# Patient Record
Sex: Female | Born: 1995 | Race: Black or African American | Hispanic: No | Marital: Single | Smoking: Never smoker
Health system: Southern US, Community
[De-identification: ages and names within clinical notes are randomized; demographics above are authoritative.]

## PROBLEM LIST (undated history)

## (undated) ENCOUNTER — Inpatient Hospital Stay (HOSPITAL_COMMUNITY): Payer: Self-pay

## (undated) ENCOUNTER — Emergency Department (HOSPITAL_COMMUNITY): Admission: EM | Discharge status: Absent without leave | Payer: Self-pay

## (undated) DIAGNOSIS — B359 Dermatophytosis, unspecified: Secondary | ICD-10-CM

## (undated) HISTORY — DX: Dermatophytosis, unspecified: B35.9

## (undated) HISTORY — PX: WISDOM TOOTH EXTRACTION: SHX21

---
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
## 2014-01-06 ENCOUNTER — Encounter: Payer: BC Managed Care – HMO | Admitting: Obstetrics and Gynecology

## 2014-01-19 ENCOUNTER — Ambulatory Visit (INDEPENDENT_AMBULATORY_CARE_PROVIDER_SITE_OTHER): Payer: BC Managed Care – PPO | Admitting: Advanced Practice Midwife

## 2014-01-19 VITALS — BP 116/65 | HR 99 | Temp 99.0°F | Wt 175.0 lb

## 2014-01-19 DIAGNOSIS — L209 Atopic dermatitis, unspecified: Secondary | ICD-10-CM

## 2014-01-19 DIAGNOSIS — Z3480 Encounter for supervision of other normal pregnancy, unspecified trimester: Secondary | ICD-10-CM

## 2014-01-19 DIAGNOSIS — L2089 Other atopic dermatitis: Secondary | ICD-10-CM

## 2014-01-19 DIAGNOSIS — IMO0002 Reserved for concepts with insufficient information to code with codable children: Secondary | ICD-10-CM

## 2014-01-19 DIAGNOSIS — Z0489 Encounter for examination and observation for other specified reasons: Secondary | ICD-10-CM

## 2014-01-19 LAB — POCT URINALYSIS DIP (DEVICE)
Bilirubin Urine: NEGATIVE
Glucose, UA: NEGATIVE mg/dL
HGB URINE DIPSTICK: NEGATIVE
Ketones, ur: NEGATIVE mg/dL
Leukocytes, UA: NEGATIVE
Nitrite: NEGATIVE
PH: 7 (ref 5.0–8.0)
Protein, ur: NEGATIVE mg/dL
Specific Gravity, Urine: 1.015 (ref 1.005–1.030)
UROBILINOGEN UA: 0.2 mg/dL (ref 0.0–1.0)

## 2014-01-19 MED ORDER — HYDROCORTISONE 1 % EX LOTN: 1.0000 "application " | TOPICAL_LOTION | Freq: Two times a day (BID) | CUTANEOUS | Status: DC

## 2014-01-19 NOTE — Progress Notes (Signed)
Pain-back F/u US scheduled @ July 16th @ 0730 [facility] scheduled for October 9th @ 0930.  [facility] @ [phone].

## 2014-01-19 NOTE — Progress Notes (Signed)
Doing well.  Good fetal movement, denies vaginal bleeding, LOF, regular contractions. Pt reports skin rash still present, treated x3 months for ringworm with topical anti-fungal with no improvement.  Circular patches of dry skin on arms/legs with itching.  Consider eczema or contact dermatitis so hydrocortisone lotion Rx sent to pharmacy.  Dermatology referral made.

## 2014-01-21 ENCOUNTER — Ambulatory Visit (HOSPITAL_COMMUNITY)
Admission: RE | Admit: 2014-01-21 | Discharge: 2014-01-21 | Disposition: A | Discharge status: Home / Self Care | Payer: BC Managed Care – PPO | Source: Ambulatory Visit | Attending: Advanced Practice Midwife | Admitting: Advanced Practice Midwife

## 2014-01-21 DIAGNOSIS — Z0489 Encounter for examination and observation for other specified reasons: Secondary | ICD-10-CM

## 2014-01-21 DIAGNOSIS — Z363 Encounter for antenatal screening for malformations: Secondary | ICD-10-CM | POA: Insufficient documentation

## 2014-01-21 DIAGNOSIS — Z1389 Encounter for screening for other disorder: Secondary | ICD-10-CM | POA: Insufficient documentation

## 2014-01-21 DIAGNOSIS — IMO0002 Reserved for concepts with insufficient information to code with codable children: Secondary | ICD-10-CM

## 2014-02-17 ENCOUNTER — Ambulatory Visit (INDEPENDENT_AMBULATORY_CARE_PROVIDER_SITE_OTHER): Payer: BC Managed Care – PPO | Admitting: Advanced Practice Midwife

## 2014-02-17 VITALS — BP 109/60 | HR 83 | Wt 177.7 lb

## 2014-02-17 DIAGNOSIS — Z23 Encounter for immunization: Secondary | ICD-10-CM | POA: Diagnosis not present

## 2014-02-17 DIAGNOSIS — Z3480 Encounter for supervision of other normal pregnancy, unspecified trimester: Secondary | ICD-10-CM

## 2014-02-17 DIAGNOSIS — Z3492 Encounter for supervision of normal pregnancy, unspecified, second trimester: Secondary | ICD-10-CM

## 2014-02-17 LAB — CBC
HEMATOCRIT: 31 % — AB (ref 36.0–46.0)
Hemoglobin: 10 g/dL — ABNORMAL LOW (ref 12.0–15.0)
MCH: 24.5 pg — ABNORMAL LOW (ref 26.0–34.0)
MCHC: 32.3 g/dL (ref 30.0–36.0)
MCV: 76 fL — AB (ref 78.0–100.0)
Platelets: 222 10*3/uL (ref 150–400)
RBC: 4.08 MIL/uL (ref 3.87–5.11)
RDW: 16.2 % — ABNORMAL HIGH (ref 11.5–15.5)
WBC: 5.8 10*3/uL (ref 4.0–10.5)

## 2014-02-17 LAB — POCT URINALYSIS DIP (DEVICE)
BILIRUBIN URINE: NEGATIVE
Glucose, UA: NEGATIVE mg/dL
Hgb urine dipstick: NEGATIVE
KETONES UR: NEGATIVE mg/dL
Nitrite: NEGATIVE
PH: 7 (ref 5.0–8.0)
PROTEIN: NEGATIVE mg/dL
SPECIFIC GRAVITY, URINE: 1.015 (ref 1.005–1.030)
Urobilinogen, UA: 1 mg/dL (ref 0.0–1.0)

## 2014-02-17 MED ORDER — TETANUS-DIPHTH-ACELL PERTUSSIS 5-2.5-18.5 LF-MCG/0.5 IM SUSP: 0.5000 mL | Freq: Once | INTRAMUSCULAR | Status: DC

## 2014-02-17 NOTE — Progress Notes (Signed)
No complaints. Feeling well. GTT and labs today. Desire Tdap.

## 2014-02-18 LAB — RPR

## 2014-02-18 LAB — GLUCOSE TOLERANCE, 1 HOUR (50G) W/O FASTING: GLUCOSE 1 HOUR GTT: 72 mg/dL (ref 70–140)

## 2014-02-18 LAB — HIV ANTIBODY (ROUTINE TESTING W REFLEX): HIV 1&2 Ab, 4th Generation: NONREACTIVE

## 2014-03-10 ENCOUNTER — Encounter: Payer: BC Managed Care – PPO | Admitting: Advanced Practice Midwife

## 2014-03-24 ENCOUNTER — Ambulatory Visit (INDEPENDENT_AMBULATORY_CARE_PROVIDER_SITE_OTHER): Payer: BC Managed Care – PPO | Admitting: Advanced Practice Midwife

## 2014-03-24 ENCOUNTER — Other Ambulatory Visit: Payer: Self-pay | Admitting: Advanced Practice Midwife

## 2014-03-24 VITALS — BP 104/60 | HR 75 | Temp 98.4°F | Wt 181.4 lb

## 2014-03-24 DIAGNOSIS — Z23 Encounter for immunization: Secondary | ICD-10-CM

## 2014-03-24 DIAGNOSIS — O26893 Other specified pregnancy related conditions, third trimester: Secondary | ICD-10-CM

## 2014-03-24 DIAGNOSIS — Z3480 Encounter for supervision of other normal pregnancy, unspecified trimester: Secondary | ICD-10-CM

## 2014-03-24 DIAGNOSIS — O4703 False labor before 37 completed weeks of gestation, third trimester: Secondary | ICD-10-CM

## 2014-03-24 DIAGNOSIS — N898 Other specified noninflammatory disorders of vagina: Secondary | ICD-10-CM

## 2014-03-24 DIAGNOSIS — Z3493 Encounter for supervision of normal pregnancy, unspecified, third trimester: Secondary | ICD-10-CM

## 2014-03-24 DIAGNOSIS — O9989 Other specified diseases and conditions complicating pregnancy, childbirth and the puerperium: Secondary | ICD-10-CM

## 2014-03-24 DIAGNOSIS — O4700 False labor before 37 completed weeks of gestation, unspecified trimester: Secondary | ICD-10-CM

## 2014-03-24 LAB — POCT URINALYSIS DIP (DEVICE)
BILIRUBIN URINE: NEGATIVE
Glucose, UA: NEGATIVE mg/dL
Hgb urine dipstick: NEGATIVE
Ketones, ur: NEGATIVE mg/dL
NITRITE: NEGATIVE
PH: 7 (ref 5.0–8.0)
PROTEIN: NEGATIVE mg/dL
Specific Gravity, Urine: 1.015 (ref 1.005–1.030)
Urobilinogen, UA: 0.2 mg/dL (ref 0.0–1.0)

## 2014-03-24 NOTE — Patient Instructions (Signed)

## 2014-03-24 NOTE — Progress Notes (Signed)
C/o of intermittent pelvic pressure and cramping.  C/o of thick white discharge causing vaginal itching and states "It feels swollen down there." Wet prep today.  Discussed flu vaccine-- patient desires today.

## 2014-03-24 NOTE — Progress Notes (Signed)
4 UC's/hour. Increased pelvic pressure, white discharge, ?leaking small amount of fluid, vaginal engorgement. Last IC yesterday. Neg pool. Small amount of creamy, mildly malodorous white discharge. Baby engaged in pelvis. PTL precautions. Wet Prep, GC/CT. Reviewed 28 week labs.

## 2014-03-25 LAB — WET PREP, GENITAL
CLUE CELLS WET PREP: NONE SEEN
Trich, Wet Prep: NONE SEEN
Yeast Wet Prep HPF POC: NONE SEEN

## 2014-03-25 LAB — GC/CHLAMYDIA PROBE AMP
CT Probe RNA: NEGATIVE
GC Probe RNA: NEGATIVE

## 2014-04-07 ENCOUNTER — Encounter: Payer: BC Managed Care – PPO | Admitting: Advanced Practice Midwife

## 2014-04-14 ENCOUNTER — Encounter: Payer: Self-pay | Admitting: Obstetrics and Gynecology

## 2014-04-14 ENCOUNTER — Ambulatory Visit (INDEPENDENT_AMBULATORY_CARE_PROVIDER_SITE_OTHER): Payer: BC Managed Care – PPO | Admitting: Obstetrics and Gynecology

## 2014-04-14 VITALS — BP 112/67 | HR 93 | Wt 182.1 lb

## 2014-04-14 DIAGNOSIS — O09893 Supervision of other high risk pregnancies, third trimester: Secondary | ICD-10-CM | POA: Insufficient documentation

## 2014-04-14 LAB — POCT URINALYSIS DIP (DEVICE)
BILIRUBIN URINE: NEGATIVE
GLUCOSE, UA: NEGATIVE mg/dL
HGB URINE DIPSTICK: NEGATIVE
Ketones, ur: NEGATIVE mg/dL
NITRITE: NEGATIVE
Protein, ur: NEGATIVE mg/dL
Specific Gravity, Urine: 1.02 (ref 1.005–1.030)
Urobilinogen, UA: 1 mg/dL (ref 0.0–1.0)
pH: 6 (ref 5.0–8.0)

## 2014-04-14 NOTE — Patient Instructions (Signed)

## 2014-04-14 NOTE — Progress Notes (Signed)
Pt experiencing pelvic pain and back pain.

## 2014-04-14 NOTE — Progress Notes (Signed)
Irregular UCs, pressure, groin and LBP, pelvic pain>Head low, cx posterior, very soft. Signs/sx labor and plans reviewed.  Hgb 10 and has ice PICA. Advised iron rich foods and start FeSo4 1 /d. Reviewed plans, normal GCT. Cultures next.

## 2014-04-21 ENCOUNTER — Ambulatory Visit (INDEPENDENT_AMBULATORY_CARE_PROVIDER_SITE_OTHER): Payer: BC Managed Care – PPO | Admitting: Advanced Practice Midwife

## 2014-04-21 VITALS — BP 110/63 | HR 88 | Temp 98.6°F | Wt 183.2 lb

## 2014-04-21 DIAGNOSIS — Z3493 Encounter for supervision of normal pregnancy, unspecified, third trimester: Secondary | ICD-10-CM

## 2014-04-21 DIAGNOSIS — Z3483 Encounter for supervision of other normal pregnancy, third trimester: Secondary | ICD-10-CM

## 2014-04-21 LAB — POCT URINALYSIS DIP (DEVICE)
Bilirubin Urine: NEGATIVE
GLUCOSE, UA: NEGATIVE mg/dL
Hgb urine dipstick: NEGATIVE
Ketones, ur: NEGATIVE mg/dL
Leukocytes, UA: NEGATIVE
Nitrite: NEGATIVE
Protein, ur: NEGATIVE mg/dL
Specific Gravity, Urine: 1.015 (ref 1.005–1.030)
UROBILINOGEN UA: 0.2 mg/dL (ref 0.0–1.0)
pH: 8.5 — ABNORMAL HIGH (ref 5.0–8.0)

## 2014-04-21 LAB — OB RESULTS CONSOLE GC/CHLAMYDIA
Chlamydia: NEGATIVE
GC PROBE AMP, GENITAL: NEGATIVE

## 2014-04-21 NOTE — Progress Notes (Signed)
GSB, GC/Chl today.

## 2014-04-21 NOTE — Progress Notes (Signed)
Reports intermittent pelvic pressure and irregular contractions since 7am this morning-- reports contractions are painful when they occur.  GBS and cultures today.

## 2014-04-21 NOTE — Patient Instructions (Signed)
Braxton Hicks Contractions Contractions of the uterus can occur throughout pregnancy. Contractions are not always a sign that you are in labor.  WHAT ARE BRAXTON HICKS CONTRACTIONS?  Contractions that occur before labor are called Braxton Hicks contractions, or false labor. Toward the end of pregnancy (32-34 weeks), these contractions can develop more often and may become more forceful. This is not true labor because these contractions do not result in opening (dilatation) and thinning of the cervix. They are sometimes difficult to tell apart from true labor because these contractions can be forceful and people have different pain tolerances. You should not feel embarrassed if you go to the hospital with false labor. Sometimes, the only way to tell if you are in true labor is for your health care provider to look for changes in the cervix. If there are no prenatal problems or other health problems associated with the pregnancy, it is completely safe to be sent home with false labor and await the onset of true labor. HOW CAN YOU TELL THE DIFFERENCE BETWEEN TRUE AND FALSE LABOR? False Labor  The contractions of false labor are usually shorter and not as hard as those of true labor.   The contractions are usually irregular.   The contractions are often felt in the front of the lower abdomen and in the groin.   The contractions may go away when you walk around or change positions while lying down.   The contractions get weaker and are shorter lasting as time goes on.   The contractions do not usually become progressively stronger, regular, and closer together as with true labor.  True Labor  Contractions in true labor last 30-70 seconds, become very regular, usually become more intense, and increase in frequency.   The contractions do not go away with walking.   The discomfort is usually felt in the top of the uterus and spreads to the lower abdomen and low back.   True labor can be  determined by your health care provider with an exam. This will show that the cervix is dilating and getting thinner.  WHAT TO REMEMBER  Keep up with your usual exercises and follow other instructions given by your health care provider.   Take medicines as directed by your health care provider.   Keep your regular prenatal appointments.   Eat and drink lightly if you think you are going into labor.   If Braxton Hicks contractions are making you uncomfortable:   Change your position from lying down or resting to walking, or from walking to resting.   Sit and rest in a tub of warm water.   Drink 2-3 glasses of water. Dehydration may cause these contractions.   Do slow and deep breathing several times an hour.  WHEN SHOULD I SEEK IMMEDIATE MEDICAL CARE? Seek immediate medical care if:  Your contractions become stronger, more regular, and closer together.   You have fluid leaking or gushing from your vagina.   You have a fever.   You pass blood-tinged mucus.   You have vaginal bleeding.   You have continuous abdominal pain.   You have low back pain that you never had before.   You feel your baby's head pushing down and causing pelvic pressure.   Your baby is not moving as much as it used to.  Document Released: 06/25/2005 Document Revised: 06/30/2013 Document Reviewed: 04/06/2013 Exit[employer] Patient Information 2015 [employer]. This information is not intended to replace advice given to you by your health care   provider. Make sure you discuss any questions you have with your health care provider.  Fetal Movement Counts Patient Name: __________________________________________________ Patient Due Date: ____________________ Performing a fetal movement count is highly recommended in high-risk pregnancies, but it is good for every pregnant woman to do. Your health care provider may ask you to start counting fetal movements at 28 weeks of the pregnancy. Fetal  movements often increase:  After eating a full meal.  After physical activity.  After eating or drinking something sweet or cold.  At rest. Pay attention to when you feel the baby is most active. This will help you notice a pattern of your baby's sleep and wake cycles and what factors contribute to an increase in fetal movement. It is important to perform a fetal movement count at the same time each day when your baby is normally most active.  HOW TO COUNT FETAL MOVEMENTS 1. Find a quiet and comfortable area to sit or lie down on your left side. Lying on your left side provides the best blood and oxygen circulation to your baby. 2. Write down the day and time on a sheet of paper or in a journal. 3. Start counting kicks, flutters, swishes, rolls, or jabs in a 2-hour period. You should feel at least 10 movements within 2 hours. 4. If you do not feel 10 movements in 2 hours, wait 2-3 hours and count again. Look for a change in the pattern or not enough counts in 2 hours. SEEK MEDICAL CARE IF:  You feel less than 10 counts in 2 hours, tried twice.  There is no movement in over an hour.  The pattern is changing or taking longer each day to reach 10 counts in 2 hours.  You feel the baby is not moving as he or she usually does. Date: ____________ Movements: ____________ Start time: ____________ Finish time: ____________  Date: ____________ Movements: ____________ Start time: ____________ Finish time: ____________ Date: ____________ Movements: ____________ Start time: ____________ Finish time: ____________ Date: ____________ Movements: ____________ Start time: ____________ Finish time: ____________ Date: ____________ Movements: ____________ Start time: ____________ Finish time: ____________ Date: ____________ Movements: ____________ Start time: ____________ Finish time: ____________ Date: ____________ Movements: ____________ Start time: ____________ Finish time: ____________ Date: ____________  Movements: ____________ Start time: ____________ Finish time: ____________  Date: ____________ Movements: ____________ Start time: ____________ Finish time: ____________ Date: ____________ Movements: ____________ Start time: ____________ Finish time: ____________ Date: ____________ Movements: ____________ Start time: ____________ Finish time: ____________ Date: ____________ Movements: ____________ Start time: ____________ Finish time: ____________ Date: ____________ Movements: ____________ Start time: ____________ Finish time: ____________ Date: ____________ Movements: ____________ Start time: ____________ Finish time: ____________ Date: ____________ Movements: ____________ Start time: ____________ Finish time: ____________  Date: ____________ Movements: ____________ Start time: ____________ Finish time: ____________ Date: ____________ Movements: ____________ Start time: ____________ Finish time: ____________ Date: ____________ Movements: ____________ Start time: ____________ Finish time: ____________ Date: ____________ Movements: ____________ Start time: ____________ Finish time: ____________ Date: ____________ Movements: ____________ Start time: ____________ Finish time: ____________ Date: ____________ Movements: ____________ Start time: ____________ Finish time: ____________ Date: ____________ Movements: ____________ Start time: ____________ Finish time: ____________  Date: ____________ Movements: ____________ Start time: ____________ Finish time: ____________ Date: ____________ Movements: ____________ Start time: ____________ Finish time: ____________ Date: ____________ Movements: ____________ Start time: ____________ Finish time: ____________ Date: ____________ Movements: ____________ Start time: ____________ Finish time: ____________ Date: ____________ Movements: ____________ Start time: ____________ Finish time: ____________ Date: ____________ Movements: ____________ Start time:  ____________ Finish time: ____________ Date: ____________ Movements:   ____________ Start time: ____________ Finish time: ____________  Date: ____________ Movements: ____________ Start time: ____________ Finish time: ____________ Date: ____________ Movements: ____________ Start time: ____________ Finish time: ____________ Date: ____________ Movements: ____________ Start time: ____________ Finish time: ____________ Date: ____________ Movements: ____________ Start time: ____________ Finish time: ____________ Date: ____________ Movements: ____________ Start time: ____________ Finish time: ____________ Date: ____________ Movements: ____________ Start time: ____________ Finish time: ____________ Date: ____________ Movements: ____________ Start time: ____________ Finish time: ____________  Date: ____________ Movements: ____________ Start time: ____________ Finish time: ____________ Date: ____________ Movements: ____________ Start time: ____________ Finish time: ____________ Date: ____________ Movements: ____________ Start time: ____________ Finish time: ____________ Date: ____________ Movements: ____________ Start time: ____________ Finish time: ____________ Date: ____________ Movements: ____________ Start time: ____________ Finish time: ____________ Date: ____________ Movements: ____________ Start time: ____________ Finish time: ____________ Date: ____________ Movements: ____________ Start time: ____________ Finish time: ____________  Date: ____________ Movements: ____________ Start time: ____________ Finish time: ____________ Date: ____________ Movements: ____________ Start time: ____________ Finish time: ____________ Date: ____________ Movements: ____________ Start time: ____________ Finish time: ____________ Date: ____________ Movements: ____________ Start time: ____________ Finish time: ____________ Date: ____________ Movements: ____________ Start time: ____________ Finish time: ____________ Date:  ____________ Movements: ____________ Start time: ____________ Finish time: ____________ Date: ____________ Movements: ____________ Start time: ____________ Finish time: ____________  Date: ____________ Movements: ____________ Start time: ____________ Finish time: ____________ Date: ____________ Movements: ____________ Start time: ____________ Finish time: ____________ Date: ____________ Movements: ____________ Start time: ____________ Finish time: ____________ Date: ____________ Movements: ____________ Start time: ____________ Finish time: ____________ Date: ____________ Movements: ____________ Start time: ____________ Finish time: ____________ Date: ____________ Movements: ____________ Start time: ____________ Finish time: ____________ Document Released: 07/25/2006 Document Revised: 11/09/2013 Document Reviewed: 04/21/2012 [employer] Patient Information 2015 [employer]. This information is not intended to replace advice given to you by your health care provider. Make sure you discuss any questions you have with your health care provider.  

## 2014-04-22 LAB — GC/CHLAMYDIA PROBE AMP
CT Probe RNA: NEGATIVE
GC PROBE AMP APTIMA: NEGATIVE

## 2014-04-23 LAB — CULTURE, BETA STREP (GROUP B ONLY)

## 2014-04-24 ENCOUNTER — Encounter: Payer: Self-pay | Admitting: Advanced Practice Midwife

## 2014-04-25 ENCOUNTER — Inpatient Hospital Stay (HOSPITAL_COMMUNITY)
Admission: AD | Admit: 2014-04-25 | Discharge: 2014-04-28 | DRG: 775 | Disposition: A | Discharge status: Home / Self Care | Payer: BC Managed Care – PPO | Source: Ambulatory Visit | Attending: Obstetrics & Gynecology | Admitting: Obstetrics & Gynecology

## 2014-04-25 ENCOUNTER — Encounter (HOSPITAL_COMMUNITY): Payer: Self-pay

## 2014-04-25 DIAGNOSIS — Z3A37 37 weeks gestation of pregnancy: Secondary | ICD-10-CM | POA: Diagnosis present

## 2014-04-25 DIAGNOSIS — IMO0001 Reserved for inherently not codable concepts without codable children: Secondary | ICD-10-CM

## 2014-04-25 DIAGNOSIS — O99824 Streptococcus B carrier state complicating childbirth: Principal | ICD-10-CM | POA: Diagnosis present

## 2014-04-25 NOTE — H&P (Signed)
Tabitha Case is a 18 y.o. female presenting for labor. States contractions are getting stronger and are 1-3 min apart. Maternal Medical History:  Reason for admission: Contractions.   Contractions: Onset was 3-5 hours ago.   Frequency: regular.   Perceived severity is moderate.    Fetal activity: Perceived fetal activity is normal.   Last perceived fetal movement was within the past hour.      OB History   Grav Para Term Preterm Abortions TAB SAB Ect Mult Living   2 1 1       1      Past Medical History  Diagnosis Date  . Ringworm    History reviewed. No pertinent past surgical history. Family History: family history is not on file. Social History:  reports that she has never smoked. She does not have any smokeless tobacco history on file. She reports that she does not drink alcohol or use illicit drugs.   Prenatal Transfer Tool  Maternal Diabetes: No Genetic Screening: Normal Maternal Ultrasounds/Referrals: Normal Fetal Ultrasounds or other Referrals:  None Maternal Substance Abuse:  No Significant Maternal Medications:  None Significant Maternal Lab Results:  Lab values include: Group B Strep positive Other Comments:  None  Review of Systems  Constitutional: Negative.   HENT: Negative.   Eyes: Negative.   Respiratory: Negative.   Cardiovascular: Negative.   Gastrointestinal: Positive for abdominal pain.  Genitourinary: Negative.   Musculoskeletal: Negative.   Skin: Negative.   Neurological: Negative.   Endo/Heme/Allergies: Negative.   Psychiatric/Behavioral: Negative.     Dilation: 4 Effacement (%): 80 Station: 0 Exam by:: D Simpson RN Blood pressure 116/75, pulse 107, temperature 98.3 F (36.8 C), temperature source Oral, resp. rate 18, last menstrual period 08/10/2013, SpO2 100.00%, not currently breastfeeding. Maternal Exam:  Uterine Assessment: Contraction strength is moderate.  Contraction frequency is regular.   Abdomen: Patient reports no  abdominal tenderness. Fetal presentation: vertex  Introitus: Normal vulva. Normal vagina.    Fetal Exam Fetal Monitor Review: Mode: ultrasound.   Variability: moderate (6-25 bpm).   Pattern: accelerations present.    Fetal State Assessment: Category I - tracings are normal.     Physical Exam  Constitutional: She is oriented to person, place, and time. She appears well-developed and well-nourished.  HENT:  Head: Normocephalic.  Cardiovascular: Normal rate, regular rhythm, normal heart sounds and intact distal pulses.   Respiratory: Effort normal and breath sounds normal.  GI: Soft. Bowel sounds are normal.  Genitourinary: Vagina normal and uterus normal.  Musculoskeletal: Normal range of motion.  Neurological: She is alert and oriented to person, place, and time. She has normal reflexes.  Skin: Skin is warm and dry.  Psychiatric: She has a normal mood and affect. Her behavior is normal. Judgment and thought content normal.    Prenatal labs: ABO, Rh: O/POS/-- (05/13 1512) Antibody: NEG (05/13 1512) Rubella: 2.99 (05/13 1512) RPR: NON REAC (08/12 1451)  HBsAg: NEGATIVE (05/13 1512)  HIV: NONREACTIVE (08/12 1451)  GBS: Positive (11/11 0000)   Assessment/Plan: Probable early labor. Will ambulate and if cervical change will admit and do GBS prophalasis   Tabitha Case DARLENE 04/25/2014, 10:32 PM

## 2014-04-25 NOTE — MAU Note (Signed)
Pt presents complaining of contractions she states are every minute that started about an hour ago. States she feels like she started leaking ten minutes before arrival to MAU. Denies bleeding. Reports good fetal movement.

## 2014-04-25 NOTE — Progress Notes (Signed)
Notified of pt arrival in MAU and vaginal exam. Received orders to let pt walk when strip is reactive and recheck in 1 hour. If make cervical change, put in admission orders to labor and delivery

## 2014-04-26 ENCOUNTER — Encounter (HOSPITAL_COMMUNITY): Payer: BC Managed Care – PPO | Admitting: Anesthesiology

## 2014-04-26 ENCOUNTER — Inpatient Hospital Stay (HOSPITAL_COMMUNITY): Payer: BC Managed Care – PPO | Admitting: Anesthesiology

## 2014-04-26 ENCOUNTER — Encounter (HOSPITAL_COMMUNITY): Payer: Self-pay

## 2014-04-26 DIAGNOSIS — O471 False labor at or after 37 completed weeks of gestation: Secondary | ICD-10-CM | POA: Diagnosis present

## 2014-04-26 DIAGNOSIS — Z3A37 37 weeks gestation of pregnancy: Secondary | ICD-10-CM | POA: Diagnosis present

## 2014-04-26 DIAGNOSIS — IMO0001 Reserved for inherently not codable concepts without codable children: Secondary | ICD-10-CM

## 2014-04-26 DIAGNOSIS — O99824 Streptococcus B carrier state complicating childbirth: Secondary | ICD-10-CM | POA: Diagnosis present

## 2014-04-26 LAB — CBC
HCT: 31.6 % — ABNORMAL LOW (ref 36.0–46.0)
HEMOGLOBIN: 10.2 g/dL — AB (ref 12.0–15.0)
MCH: 24.5 pg — AB (ref 26.0–34.0)
MCHC: 32.3 g/dL (ref 30.0–36.0)
MCV: 76 fL — ABNORMAL LOW (ref 78.0–100.0)
Platelets: 237 10*3/uL (ref 150–400)
RBC: 4.16 MIL/uL (ref 3.87–5.11)
RDW: 14.2 % (ref 11.5–15.5)
WBC: 8.3 10*3/uL (ref 4.0–10.5)

## 2014-04-26 LAB — RPR

## 2014-04-26 LAB — HIV ANTIBODY (ROUTINE TESTING W REFLEX): HIV: NONREACTIVE

## 2014-04-26 MED ORDER — DIBUCAINE 1 % RE OINT: 1.0000 "application " | TOPICAL_OINTMENT | RECTAL | Status: DC | PRN

## 2014-04-26 MED ORDER — PRENATAL MULTIVITAMIN CH
1.0000 | ORAL_TABLET | Freq: Every day | ORAL | Status: DC
Start: 2014-04-26 — End: 2014-04-28
  Administered 2014-04-27 – 2014-04-28 (×2): 1 via ORAL
  Filled 2014-04-26 (×2): qty 1

## 2014-04-26 MED ORDER — OXYCODONE-ACETAMINOPHEN 5-325 MG PO TABS: 2.0000 | ORAL_TABLET | ORAL | Status: DC | PRN

## 2014-04-26 MED ORDER — PENICILLIN G POTASSIUM 5000000 UNITS IJ SOLR
2.5000 10*6.[IU] | INTRAMUSCULAR | Status: DC
  Administered 2014-04-26 (×2): 2.5 10*6.[IU] via INTRAVENOUS
  Filled 2014-04-26 (×7): qty 2.5

## 2014-04-26 MED ORDER — TETANUS-DIPHTH-ACELL PERTUSSIS 5-2.5-18.5 LF-MCG/0.5 IM SUSP: 0.5000 mL | Freq: Once | INTRAMUSCULAR | Status: DC

## 2014-04-26 MED ORDER — SENNOSIDES-DOCUSATE SODIUM 8.6-50 MG PO TABS
2.0000 | ORAL_TABLET | ORAL | Status: DC
  Administered 2014-04-27: 2 via ORAL
  Filled 2014-04-26 (×2): qty 2

## 2014-04-26 MED ORDER — WITCH HAZEL-GLYCERIN EX PADS: 1.0000 "application " | MEDICATED_PAD | CUTANEOUS | Status: DC | PRN

## 2014-04-26 MED ORDER — ACETAMINOPHEN 325 MG PO TABS: 650.0000 mg | ORAL_TABLET | ORAL | Status: DC | PRN

## 2014-04-26 MED ORDER — PHENYLEPHRINE 40 MCG/ML (10ML) SYRINGE FOR IV PUSH (FOR BLOOD PRESSURE SUPPORT)
80.0000 ug | PREFILLED_SYRINGE | INTRAVENOUS | Status: DC | PRN
  Filled 2014-04-26: qty 2

## 2014-04-26 MED ORDER — OXYCODONE-ACETAMINOPHEN 5-325 MG PO TABS: 1.0000 | ORAL_TABLET | ORAL | Status: DC | PRN

## 2014-04-26 MED ORDER — EPHEDRINE 5 MG/ML INJ
10.0000 mg | INTRAVENOUS | Status: DC | PRN
  Filled 2014-04-26: qty 2

## 2014-04-26 MED ORDER — IBUPROFEN 600 MG PO TABS
600.0000 mg | ORAL_TABLET | Freq: Four times a day (QID) | ORAL | Status: DC
Start: 2014-04-26 — End: 2014-04-28
  Administered 2014-04-26 – 2014-04-28 (×8): 600 mg via ORAL
  Filled 2014-04-26 (×8): qty 1

## 2014-04-26 MED ORDER — DIPHENHYDRAMINE HCL 50 MG/ML IJ SOLN: 12.5000 mg | INTRAMUSCULAR | Status: DC | PRN

## 2014-04-26 MED ORDER — ONDANSETRON HCL 4 MG/2ML IJ SOLN: 4.0000 mg | INTRAMUSCULAR | Status: DC | PRN

## 2014-04-26 MED ORDER — OXYTOCIN BOLUS FROM INFUSION
500.0000 mL | INTRAVENOUS | Status: DC
  Administered 2014-04-26: 500 mL via INTRAVENOUS

## 2014-04-26 MED ORDER — FENTANYL 2.5 MCG/ML BUPIVACAINE 1/10 % EPIDURAL INFUSION (WH - ANES)
14.0000 mL/h | INTRAMUSCULAR | Status: DC | PRN
  Administered 2014-04-26: 14 mL/h via EPIDURAL
  Filled 2014-04-26: qty 125

## 2014-04-26 MED ORDER — DIPHENHYDRAMINE HCL 25 MG PO CAPS: 25.0000 mg | ORAL_CAPSULE | Freq: Four times a day (QID) | ORAL | Status: DC | PRN

## 2014-04-26 MED ORDER — LIDOCAINE HCL (PF) 1 % IJ SOLN
INTRAMUSCULAR | Status: DC | PRN
  Administered 2014-04-26: 3 mL
  Administered 2014-04-26 (×2): 5 mL

## 2014-04-26 MED ORDER — OXYTOCIN 40 UNITS IN LACTATED RINGERS INFUSION - SIMPLE MED: 62.5000 mL/h | INTRAVENOUS | Status: DC

## 2014-04-26 MED ORDER — CITRIC ACID-SODIUM CITRATE 334-500 MG/5ML PO SOLN: 30.0000 mL | ORAL | Status: DC | PRN

## 2014-04-26 MED ORDER — LIDOCAINE HCL 1 % IJ SOLN
0.0000 mL | Freq: Once | INTRAMUSCULAR | Status: AC | PRN
  Filled 2014-04-26: qty 20

## 2014-04-26 MED ORDER — FLEET ENEMA 7-19 GM/118ML RE ENEM: 1.0000 | ENEMA | RECTAL | Status: DC | PRN

## 2014-04-26 MED ORDER — FENTANYL CITRATE 0.05 MG/ML IJ SOLN
100.0000 ug | INTRAMUSCULAR | Status: DC | PRN
  Administered 2014-04-26 (×3): 100 ug via INTRAVENOUS
  Filled 2014-04-26 (×3): qty 2

## 2014-04-26 MED ORDER — LIDOCAINE HCL (PF) 1 % IJ SOLN
30.0000 mL | INTRAMUSCULAR | Status: DC | PRN
  Filled 2014-04-26: qty 30

## 2014-04-26 MED ORDER — LANOLIN HYDROUS EX OINT: TOPICAL_OINTMENT | CUTANEOUS | Status: DC | PRN

## 2014-04-26 MED ORDER — PHENYLEPHRINE 40 MCG/ML (10ML) SYRINGE FOR IV PUSH (FOR BLOOD PRESSURE SUPPORT)
80.0000 ug | PREFILLED_SYRINGE | INTRAVENOUS | Status: DC | PRN
  Filled 2014-04-26: qty 10
  Filled 2014-04-26: qty 2

## 2014-04-26 MED ORDER — ONDANSETRON HCL 4 MG/2ML IJ SOLN: 4.0000 mg | Freq: Four times a day (QID) | INTRAMUSCULAR | Status: DC | PRN

## 2014-04-26 MED ORDER — FENTANYL 2.5 MCG/ML BUPIVACAINE 1/10 % EPIDURAL INFUSION (WH - ANES)
INTRAMUSCULAR | Status: DC | PRN
  Administered 2014-04-26: 14 mL/h via EPIDURAL

## 2014-04-26 MED ORDER — ETONOGESTREL 68 MG ~~LOC~~ IMPL
68.0000 mg | DRUG_IMPLANT | Freq: Once | SUBCUTANEOUS | Status: AC
  Administered 2014-04-26: 68 mg via SUBCUTANEOUS
  Filled 2014-04-26: qty 1

## 2014-04-26 MED ORDER — LACTATED RINGERS IV SOLN: 500.0000 mL | INTRAVENOUS | Status: DC | PRN

## 2014-04-26 MED ORDER — SIMETHICONE 80 MG PO CHEW: 80.0000 mg | CHEWABLE_TABLET | ORAL | Status: DC | PRN

## 2014-04-26 MED ORDER — ZOLPIDEM TARTRATE 5 MG PO TABS: 5.0000 mg | ORAL_TABLET | Freq: Every evening | ORAL | Status: DC | PRN

## 2014-04-26 MED ORDER — HYDROCODONE-ACETAMINOPHEN 5-325 MG PO TABS
1.0000 | ORAL_TABLET | ORAL | Status: DC | PRN
  Administered 2014-04-26 – 2014-04-28 (×6): 1 via ORAL
  Filled 2014-04-26 (×6): qty 1

## 2014-04-26 MED ORDER — OXYTOCIN 40 UNITS IN LACTATED RINGERS INFUSION - SIMPLE MED
1.0000 m[IU]/min | INTRAVENOUS | Status: DC
  Administered 2014-04-26: 2 m[IU]/min via INTRAVENOUS
  Filled 2014-04-26: qty 1000

## 2014-04-26 MED ORDER — LACTATED RINGERS IV SOLN
INTRAVENOUS | Status: DC
  Administered 2014-04-26 (×2): via INTRAVENOUS

## 2014-04-26 MED ORDER — BENZOCAINE-MENTHOL 20-0.5 % EX AERO: 1.0000 "application " | INHALATION_SPRAY | CUTANEOUS | Status: DC | PRN

## 2014-04-26 MED ORDER — LACTATED RINGERS IV SOLN
500.0000 mL | Freq: Once | INTRAVENOUS | Status: AC
Start: 2014-04-26 — End: 2014-04-26
  Administered 2014-04-26: 500 mL via INTRAVENOUS

## 2014-04-26 MED ORDER — ONDANSETRON HCL 4 MG PO TABS: 4.0000 mg | ORAL_TABLET | ORAL | Status: DC | PRN

## 2014-04-26 MED ORDER — TERBUTALINE SULFATE 1 MG/ML IJ SOLN: 0.2500 mg | Freq: Once | INTRAMUSCULAR | Status: DC | PRN

## 2014-04-26 MED ORDER — PENICILLIN G POTASSIUM 5000000 UNITS IJ SOLR
5.0000 10*6.[IU] | Freq: Once | INTRAVENOUS | Status: AC
  Administered 2014-04-26: 5 10*6.[IU] via INTRAVENOUS
  Filled 2014-04-26: qty 5

## 2014-04-26 NOTE — H&P (Signed)
Attestation of Attending Supervision of Advanced Practitioner (CNM/NP): Evaluation and management procedures were performed by the Advanced Practitioner under my supervision and collaboration.  I have reviewed the Advanced Practitioner's note and chart, and I agree with the management and plan.  HARRAWAY-SMITH, Pradyun Ishman 6:06 AM     

## 2014-04-26 NOTE — MAU Note (Signed)
Aleta charge RN reviewed strip and pt okay to go to room 175

## 2014-04-26 NOTE — Progress Notes (Signed)
Tabitha Case is a 18 y.o. G2P1001 at 2876w0d  admitted for active labor  Subjective: No complaints.  Objective: Filed Vitals:   04/26/14 0028 04/26/14 0033 04/26/14 0038 04/26/14 0105  BP:    117/65  Pulse: 101 85 94 99  Temp:    98.2 F (36.8 C)  TempSrc:    Oral  Resp:    16  Height:    5\' 9"  (1.753 m)  Weight:    83.008 kg (183 lb)  SpO2: 100% 100% 100%       FHT:  FHR: 130 bpm, variability: moderate,  accelerations:  Present,  decelerations:  Absent UC:   irregular, every 5-7 minutes SVE:   Dilation: 5.5 Effacement (%): 80 Station: +1 Exam by:: K.Forsell, RN   Labs: Lab Results  Component Value Date   WBC 8.3 04/26/2014   HGB 10.2* 04/26/2014   HCT 31.6* 04/26/2014   MCV 76.0* 04/26/2014   PLT 237 04/26/2014    Assessment / Plan: Spontaneous labor, progressing normally  Labor: Progressing normally Fetal Wellbeing:  Category I Pain Control:  Fentanyl Anticipated MOD:  NSVD  Tabitha Case, Tabitha Case 04/26/2014, 2:16 AM

## 2014-04-26 NOTE — Anesthesia Procedure Notes (Signed)
Epidural Patient location during procedure: OB  Staffing Anesthesiologist: Phillips GroutARIGNAN, Deni Berti Performed by: anesthesiologist   Preanesthetic Checklist Completed: patient identified, site marked, surgical consent, pre-op evaluation, timeout performed, IV checked, risks and benefits discussed and monitors and equipment checked  Epidural Patient position: sitting Prep: ChloraPrep Patient monitoring: heart rate, continuous pulse ox and blood pressure Approach: midline Location: L4-L5 Injection technique: LOR saline  Needle:  Needle type: Tuohy  Needle gauge: 17 G Needle length: 9 cm and 9 Needle insertion depth: 6 cm Catheter type: closed end flexible Catheter size: 20 Guage Catheter at skin depth: 10 cm Test dose: negative  Assessment Events: blood not aspirated, injection not painful, no injection resistance, negative IV test and no paresthesia  Additional Notes   Patient tolerated the insertion well without complications.

## 2014-04-26 NOTE — Anesthesia Postprocedure Evaluation (Signed)
  Anesthesia Post-op Note  Patient: Tabitha Case  Procedure(s) Performed: * No procedures listed *  Patient Location: Mother/Baby  Anesthesia Type:Epidural  Level of Consciousness: awake, alert , oriented and patient cooperative  Airway and Oxygen Therapy: Patient Spontanous Breathing  Post-op Pain: mild  Post-op Assessment: Post-op Vital signs reviewed, Patient's Cardiovascular Status Stable, Respiratory Function Stable, Patent Airway, No headache, No backache, No residual numbness and No residual motor weakness  Post-op Vital Signs: Reviewed and stable  Last Vitals:  Filed Vitals:   04/26/14 1330  BP: 113/73  Pulse: 88  Temp: 36.7 C  Resp: 20    Complications: No apparent anesthesia complications

## 2014-04-26 NOTE — Progress Notes (Signed)
Tabitha Case is a 18 y.o. G2P1001 at 7167w0d  admitted for active labor  Subjective: Feeling contractions more. Otherwise, no complaints.   Objective: Filed Vitals:   04/26/14 0038 04/26/14 0105 04/26/14 0240 04/26/14 0354  BP:  117/65 93/49 112/67  Pulse: 94 99 88 77  Temp:  98.2 F (36.8 C)    TempSrc:  Oral    Resp:  16  16  Height:  5\' 9"  (1.753 m)    Weight:  83.008 kg (183 lb)    SpO2: 100%         FHT:  FHR: 130 bpm, variability: moderate,  accelerations:  Present,  decelerations:  Absent UC:   regular, every 2-7 minutes SVE:   Dilation: 6.5 Effacement (%): 90 Station: +1 Exam by:: L.Stubbs, RN Pitocin @ 2 mu/min  Labs: Lab Results  Component Value Date   WBC 8.3 04/26/2014   HGB 10.2* 04/26/2014   HCT 31.6* 04/26/2014   MCV 76.0* 04/26/2014   PLT 237 04/26/2014    Assessment / Plan: Augmentation of labor, progressing well  Labor: Progressing normally, will start on pitocin Fetal Wellbeing:  Category I Pain Control:  Fentanyl Anticipated MOD:  NSVD  Jacquiline Doearker, Nelline Lio 04/26/2014, 5:22 AM

## 2014-04-26 NOTE — Lactation Note (Signed)
This note was copied from the chart of Tabitha Lelaina Treto. Lactation Consultation Note Initial visit at 11 hours of age.  Mom reports baby has not breast fed since 11 today and has been sleepy.  Encouraged mom to allow baby time STS every 2 1/2 hours to waken baby for feedings if baby is not showing feeding cues.  Hand expression demonstrated with a few drops of colostrum visible and applied to baby's mouth.  Baby remains with low tone and does not wake up to breast feed when STS.  Encouraged mom to pump with DEBP every 3 hours or after feeding attempts to pump 8 times in 24 hours with hand expression to collect EBM.  DEBP set up with instructions on use and cleaning including milk storage discussed.  Mom pumping on preemie setting for 15 minutes.  LPT policy discussed and mentioned supplementation and spoon feeding.  [facility] resources given and discussed.  Encouraged to feed with early cues on demand.  Early newborn behavior discussed.   Mom to call for assist as needed.  [facility] who plans to follow up with mom to see if she is collecting EBM and in implement supplementation of formula as needed.    Patient Name: Tabitha Case [id number]'s Date: 04/26/2014 Reason for consult: Initial assessment;Infant < 6lbs;Late preterm infant   Maternal Data Has patient been taught Hand Expression?: Yes Does the patient have breastfeeding experience prior to this delivery?: Yes  Feeding Feeding Type: Breast Fed Length of feed: 0 min  LATCH Score/Interventions Latch: Too sleepy or reluctant, no latch achieved, no sucking elicited.  Audible Swallowing: None  Type of Nipple: Everted at rest and after stimulation  Comfort (Breast/Nipple): Soft / non-tender     Hold (Positioning): Assistance needed to correctly position infant at breast and maintain latch. Intervention(s): Breastfeeding basics reviewed;Support Pillows;Position options;Skin to skin  LATCH Score: 5  Lactation Tools  Discussed/Used Pump Review: Setup, frequency, and cleaning;Milk Storage Initiated by:: JS Date initiated:: 04/26/14   Consult Status Consult Status: Follow-up Date: 04/27/14 Follow-up type: In-patient    Jannifer RodneyShoptaw, Jana Lynn 04/26/2014, 9:08 PM

## 2014-04-26 NOTE — Anesthesia Preprocedure Evaluation (Signed)

## 2014-04-26 NOTE — Plan of Care (Signed)
Problem: Phase I Progression Outcomes Goal: Induction meds as ordered Outcome: Completed/Met Date Met:  04/26/14 Pt admitted in active labor

## 2014-04-27 LAB — CBC
HCT: 27 % — ABNORMAL LOW (ref 36.0–46.0)
Hemoglobin: 8.8 g/dL — ABNORMAL LOW (ref 12.0–15.0)
MCH: 24.8 pg — ABNORMAL LOW (ref 26.0–34.0)
MCHC: 32.6 g/dL (ref 30.0–36.0)
MCV: 76.1 fL — ABNORMAL LOW (ref 78.0–100.0)
Platelets: 190 10*3/uL (ref 150–400)
RBC: 3.55 MIL/uL — ABNORMAL LOW (ref 3.87–5.11)
RDW: 14.2 % (ref 11.5–15.5)
WBC: 8.4 10*3/uL (ref 4.0–10.5)

## 2014-04-27 NOTE — Lactation Note (Signed)
This note was copied from the chart of Tabitha Case. Lactation Consultation Note  Follow up consult with this mom and baby, now 2528 hours old, and 37 1/7 weeks CGA, and weight 5 1/2 pounds. Mom has not been feeding every 3 hours, so I explained to her that since her baby is small, she soul at least put baby STS every 3 hours, and try to breast feed. I observed mom latching baby. Mom c/o sore nipples - slightly pinched after feeding, upper part of nipples red, tender. Comfort gels given to mom with instruction in use. I gave mom help with pillow support and positioning of her and baby, and showed her how to obtain a deeper latch. Baby doing well at the breast. I also encouraged mom to pump every 3 hours, followed by hand expression, and to supplement the baby with what she expresses, either by spoon, foley cup( mom instructed in it's use), or bottle. Mom knows to call for questions/concerns  Patient Name: Tabitha Gypsy LoreRozjae Scheel WUJWJ'XToday's Date: 04/27/2014 Reason for consult: Follow-up assessment   Maternal Data    Feeding Feeding Type: Breast Fed Length of feed: 12 min  LATCH Score/Interventions Latch: Grasps breast easily, tongue down, lips flanged, rhythmical sucking. Intervention(s): Teach feeding cues;Waking techniques  Audible Swallowing: Spontaneous and intermittent Intervention(s): Hand expression  Type of Nipple: Everted at rest and after stimulation  Comfort (Breast/Nipple): Filling, red/small blisters or bruises, mild/mod discomfort  Problem noted: Mild/Moderate discomfort;Cracked, bleeding, blisters, bruises Interventions  (Cracked/bleeding/bruising/blister): Double electric pump;Expressed breast milk to nipple Interventions (Mild/moderate discomfort): Comfort gels  Hold (Positioning): Assistance needed to correctly position infant at breast and maintain latch.  LATCH Score: 8  Lactation Tools Discussed/Used Tools: Comfort gels;Feeding cup Pump Review: Setup, frequency,  and cleaning   Consult Status Consult Status: Follow-up Date: 04/28/14 Follow-up type: In-patient    Alfred LevinsLee, Jakeel Starliper Anne 04/27/2014, 2:55 PM

## 2014-04-27 NOTE — Progress Notes (Signed)
Post Partum Day #1 Subjective: no complaints, up ad lib, voiding, tolerating PO and + flatus  Objective: Blood pressure 110/64, pulse 64, temperature 98.2 F (36.8 C), temperature source Oral, resp. rate 20, height 5\' 9"  (1.753 m), weight 183 lb (83.008 kg), last menstrual period 08/10/2013, SpO2 100.00%, unknown if currently breastfeeding.  Physical Exam:  General: alert, cooperative and no distress Lochia: appropriate Uterine Fundus: firm DVT Evaluation: No evidence of DVT seen on physical exam.   Recent Labs  04/26/14 0010 04/27/14 0540  HGB 10.2* 8.8*  HCT 31.6* 27.0*    Assessment/Plan: Plan for discharge tomorrow, Breastfeeding and Contraception Nexplanon placed yesterday   LOS: 2 days   OB fellow attestation Post Partum Day 1 I have seen and examined this patient and agree with above documentation in the resident's note.   Tabitha Case is a 18 y.o. [id number] s/p NSVD.  Pt denies problems with ambulating, voiding or po intake. Pain is well controlled.  Plan for birth control is Nexplanon, already placed.  Method of Feeding: Breast  PE:  BP 110/64  Pulse 64  Temp(Src) 98.2 F (36.8 C) (Oral)  Resp 20  Ht 5\' 9"  (1.753 m)  Wt 183 lb (83.008 kg)  BMI 27.01 kg/m2  SpO2 100%  LMP 08/10/2013  Breastfeeding? Unknown Fundus firm  Plan for discharge: PPD2  William DaltonMcEachern, Alzora Ha, MD 9:07 AM

## 2014-04-28 MED ORDER — IBUPROFEN 600 MG PO TABS: 600.0000 mg | ORAL_TABLET | Freq: Four times a day (QID) | ORAL | Status: DC

## 2014-04-28 NOTE — Progress Notes (Signed)
Attestation of Attending Supervision of Obstetric Fellow: Evaluation and management procedures were performed by the Obstetric Fellow under my supervision and collaboration.  I have reviewed the Obstetric Fellow's note and chart, and I agree with the management and plan.  Jacob Stinson, DO Attending Physician Faculty Practice, [facility]  

## 2014-04-28 NOTE — Lactation Note (Signed)
This note was copied from the chart of Tabitha Case. Lactation Consultation Note  Patient Name: Tabitha Case Reason for consult: Follow-up assessment;Infant < 6lbs Mom feels BF is going well, breasts are filling. Some nipple tenderness but Mom reports this is improving. Positional stripe on right nipple, no cracking or bleeding. Care for sore nipples reviewed, using comfort gels.  Reviewed how to obtain good depth with latch. Advised Mom baby should be at the breast 8-12 times in 24 hours, BF up to 30 minutes on 1st breast then switch to 2nd breast, alternating this pattern every feeding. Encouraged to alternate positions with feedings to help with tenderness.   Advised to monitor voids/stools. Advised to post pump as needed to empty breast and give baby back any amount of EBM she receives. Mom using foley cup to supplement and reports feeling comfortable with this method. Engorgement care reviewed if needed. Advised of OP services and support group.   Maternal Data    Feeding Feeding Type: Breast Fed Length of feed: 10 min  LATCH Score/Interventions Latch: Grasps breast easily, tongue down, lips flanged, rhythmical sucking.  Audible Swallowing: A few with stimulation  Type of Nipple: Everted at rest and after stimulation  Comfort (Breast/Nipple): Filling, red/small blisters or bruises, mild/mod discomfort  Problem noted: Filling;Mild/Moderate discomfort Interventions (Filling): Frequent nursing;Double electric pump Interventions  (Cracked/bleeding/bruising/blister): Expressed breast milk to nipple Interventions (Mild/moderate discomfort): Comfort gels  Hold (Positioning): No assistance needed to correctly position infant at breast. Intervention(s): Breastfeeding basics reviewed;Support Pillows;Position options;Skin to skin  LATCH Score: 8  Lactation Tools Discussed/Used Tools: Pump;Comfort gels;Feeding cup Breast pump type: Double-Electric  Breast Pump   Consult Status Consult Status: Complete Date: 04/28/14 Follow-up type: In-patient    Tabitha Case, Tabitha Case Ann Case, 10:09 AM

## 2014-04-28 NOTE — Discharge Instructions (Signed)

## 2014-04-28 NOTE — Discharge Summary (Signed)
Attestation of Attending Supervision of Advanced Practitioner (CNM/NP): Evaluation and management procedures were performed by the Advanced Practitioner under my supervision and collaboration.  I have reviewed the Advanced Practitioner's note and chart, and I agree with the management and plan.  Zuri Bradway 04/28/2014 9:46 AM   

## 2014-04-28 NOTE — Discharge Summary (Signed)
Obstetric Discharge Summary Reason for Admission: onset of labor Prenatal Procedures: none Intrapartum Procedures: spontaneous vaginal delivery Postpartum Procedures: none Complications-Operative and Postpartum: none  Hospital Course:  Patient was admitted in active labor. She progressed well on pitocin with no complications.   Delivery Note At 9:47 AM a viable female was delivered via Vaginal, Spontaneous Delivery (Presentation: Left Occiput Anterior).  APGAR: 8, 9; weight 5 lb 10.1 oz (2554 g).   Placenta status: Intact, Spontaneous.  Cord: 3 vessels with the following complications: None.  Cord pH: n/a  Anesthesia: Epidural  Episiotomy: None Lacerations: None Suture Repair: n/a Est. Blood Loss (mL): 150  Mom to postpartum.  Baby to Couplet care / Skin to Skin.   Postpartum, the patient did well with no complications. Nexplanon was placed on PPD#0.  On the day of discharge,  Pt denied problems with ambulating, voiding or po intake.  She denied nausea or vomiting.  Pain wass well controlled.  She has had flatus. She has had bowel movement.  Lochia Minimal.  Plan for birth control is nexplanon, already placed.    H/H: Lab Results  Component Value Date/Time   HGB 8.8* 04/27/2014  5:40 AM   HGB 161.0 02/05/2013   HCT 27.0* 04/27/2014  5:40 AM   HCT 34 02/05/2013    Filed Vitals:   04/28/14 0602  BP: 100/54  Pulse: 81  Temp: 97.8 F (36.6 C)  Resp: 19    Physical Exam: VSS NAD Abd: Appropriately tender, ND, Fundus firm No c/c/e, Neg homan's sign, neg cords Lochia Appropriate  Discharge Diagnoses: Term Pregnancy-delivered  Discharge Information: Date: 04/28/2014 Activity: pelvic rest Diet: routine  Medications: PNV, Tylenol #3 and Ibuprofen Breast feeding:  Yes Condition: stable Instructions: refer to handout Discharge to: home      Medication List         acetaminophen 325 MG tablet  Commonly known as:  TYLENOL  Take 650 mg by mouth every 6 (six)  hours as needed for headache.     ibuprofen 600 MG tablet  Commonly known as:  ADVIL,MOTRIN  Take 1 tablet (600 mg total) by mouth every 6 (six) hours.     prenatal multivitamin Tabs tablet  Take 1 tablet by mouth daily at 12 noon.           Follow-up Information   [facility] Schedule an appointment as soon as possible for a visit in 6 weeks. (for post partum follow up)    Specialty:  Obstetrics and Gynecology   Contact information:   [address] [place] [address] [phone]      Tabitha DoeParker, Tabitha 04/28/2014,7:27 AM   OB fellow attestation Post Partum Day 2 I have seen and examined this patient and agree with above documentation in the resident's note.   Gypsy LoreRozjae Case is a 18 y.o. [id number] s/p SVD.  Pt denies problems with ambulating, voiding or po intake. Pain is well controlled however does have back pain and would like percocet for pain.  Plan for birth control is nexplanon.  Method of Feeding: breast  PE:  BP 100/54  Pulse 81  Temp(Src) 97.8 F (36.6 C) (Oral)  Resp 19  Ht 5\' 9"  (1.753 m)  Wt 183 lb (83.008 kg)  BMI 27.01 kg/m2  SpO2 100%  LMP 08/10/2013  Breastfeeding? Unknown Fundus firm  Plan for discharge: today Back pain: no percocet for pain, ibuprofen rx'd only  Tabitha Case ROCIO, MD 9:01 AM

## 2014-04-29 ENCOUNTER — Telehealth: Payer: Self-pay

## 2014-04-29 DIAGNOSIS — Z3493 Encounter for supervision of normal pregnancy, unspecified, third trimester: Secondary | ICD-10-CM

## 2014-04-29 MED ORDER — CYCLOBENZAPRINE HCL 5 MG PO TABS: 5.0000 mg | ORAL_TABLET | Freq: Three times a day (TID) | ORAL | Status: DC | PRN

## 2014-04-29 NOTE — Telephone Encounter (Signed)
Called patient. Reports "a lot of back pain and cramping from the epidural." Patient reports taking 2 tabs Ibuprofen (1200mg  total) every 6 hours for pain and states its not really helping. Discussed patient with Dr. Loreta AveAcosta who agreed to order flexeril for patient's lower back pain. Explained to patient that these are normal discomforts post pregnancy--- she can take 600mg  ibuprofen q4hours or try 800mg  q6hours. She may also try heating pads. Patient verbalized understanding and gratitude. NO further questions or concerns.

## 2014-04-29 NOTE — Telephone Encounter (Signed)
Patient called stating she had her baby on the 19th and was told to call the clinic if she continued to have pain so that something stronger could be prescribed. Requests a call back.

## 2014-05-10 ENCOUNTER — Encounter (HOSPITAL_COMMUNITY): Payer: Self-pay

## 2014-06-09 ENCOUNTER — Encounter: Payer: Self-pay | Admitting: Family Medicine

## 2014-06-09 ENCOUNTER — Ambulatory Visit (INDEPENDENT_AMBULATORY_CARE_PROVIDER_SITE_OTHER): Payer: BC Managed Care – PPO | Admitting: Family Medicine

## 2014-06-09 DIAGNOSIS — B3789 Other sites of candidiasis: Secondary | ICD-10-CM

## 2014-06-09 DIAGNOSIS — B49 Unspecified mycosis: Secondary | ICD-10-CM

## 2014-06-09 DIAGNOSIS — N939 Abnormal uterine and vaginal bleeding, unspecified: Secondary | ICD-10-CM

## 2014-06-09 DIAGNOSIS — O9102 Infection of nipple associated with the puerperium: Secondary | ICD-10-CM

## 2014-06-09 DIAGNOSIS — O91019 Infection of nipple associated with pregnancy, unspecified trimester: Secondary | ICD-10-CM

## 2014-06-09 MED ORDER — NYSTATIN 100000 UNIT/GM EX CREA: 1.0000 "application " | TOPICAL_CREAM | Freq: Two times a day (BID) | CUTANEOUS | Status: DC

## 2014-06-09 NOTE — Progress Notes (Signed)
  Subjective:     Tabitha Case is a 18 y.o. female who presents for a postpartum visit. She is 5 weeks postpartum following a spontaneous vaginal delivery. I have fully reviewed the prenatal and intrapartum course. The delivery was at 37 gestational weeks. Outcome: spontaneous vaginal delivery. Anesthesia: epidural. Postpartum course has been normal. Baby's course has been normal. Baby is feeding by breast and bottle. Bleeding moderate lochia - saturating 4 pads a day. Bowel function is normal. Bladder function is normal. Patient is not sexually active. Contraception method is Nexplanon. Postpartum depression screening: positive.  The following portions of the patient's history were reviewed and updated as appropriate: allergies, current medications, past family history, past medical history, past social history, past surgical history and problem list.  Review of Systems Pertinent items are noted in HPI.   Objective:    BP 107/77 mmHg  Pulse 78  Temp(Src) 99.3 F (37.4 C)  Wt 170 lb (77.111 kg)  Breastfeeding? Yes  General:  alert, cooperative and no distress   Breasts:  redness to the nipple  Lungs: clear to auscultation bilaterally  Heart:  regular rate and rhythm, S1, S2 normal, no murmur, click, rub or gallop  Abdomen: soft, non-tender; bowel sounds normal; no masses,  no organomegaly   Vulva:  normal  Vagina: normal vagina, no discharge, exudate, lesion, or erythema  Cervix:  multiparous appearance and no cervical motion tenderness  Corpus: normal size, contour, position, consistency, mobility, non-tender  Adnexa:  normal adnexa and no mass, fullness, tenderness  Rectal Exam: Not performed.        Assessment:      Problem List Items Addressed This Visit    None    Visit Diagnoses    Postpartum examination following vaginal delivery    -  Primary    Vaginal bleeding        Relevant Orders       US Transvaginal Non-OB    Yeast infection of nipple, postpartum        Relevant Medications       nystatin cream (MYCOSTATIN)          Plan:    1. Contraception: Nexplanon - already placed during hospitalization 2. Will get US to evaluate retained products.  Will likely need methergine.  Will call with results. 3. Follow up as needed.

## 2014-06-11 ENCOUNTER — Other Ambulatory Visit: Payer: Self-pay | Admitting: Family Medicine

## 2014-06-11 ENCOUNTER — Ambulatory Visit (HOSPITAL_COMMUNITY)
Admission: RE | Admit: 2014-06-11 | Discharge: 2014-06-11 | Disposition: A | Discharge status: Home / Self Care | Payer: BC Managed Care – PPO | Source: Ambulatory Visit | Attending: Family Medicine | Admitting: Family Medicine

## 2014-06-11 DIAGNOSIS — N939 Abnormal uterine and vaginal bleeding, unspecified: Secondary | ICD-10-CM | POA: Diagnosis present

## 2014-06-14 ENCOUNTER — Other Ambulatory Visit: Payer: Self-pay | Admitting: Family Medicine

## 2014-06-14 MED ORDER — METHYLERGONOVINE MALEATE 0.2 MG PO TABS: 0.2000 mg | ORAL_TABLET | Freq: Four times a day (QID) | ORAL | Status: AC

## 2014-06-14 NOTE — Progress Notes (Signed)
Quick Note:  Called patient with results - continues to have bleeding. Prescribed methergine for 5 days. Pt to call back if continues to have bleeding after stopping. ______

## 2014-07-15 ENCOUNTER — Telehealth: Payer: Self-pay

## 2014-07-15 DIAGNOSIS — N939 Abnormal uterine and vaginal bleeding, unspecified: Secondary | ICD-10-CM

## 2014-07-15 MED ORDER — ESTRADIOL 0.5 MG PO TABS: 0.5000 mg | ORAL_TABLET | Freq: Every day | ORAL | Status: DC

## 2014-07-15 NOTE — Telephone Encounter (Signed)
Patient called stating she had Nexplanon placed after having baby and she has not stopped bleeding since delivery-- thinks it may be a bad reaction to Nexplanon-- reports bad cramps and bleeding. Called patient who reports heavy bleeding since having baby and continues to have this with bad cramping since having Nexplanon placed. Consulted Dr. Erin FullingHarraway-Smith who agreed to order Estrace 0.5mg  daily X30 days (e-prescribed to pharmacy). Informed patient of RX and explained it should help decrease, if not stop, bleeding and make it manageable. Informed patient it is not abnormal to have bleeding and intermittent bleeding with the Nexplanon for the first 6 months. Informed her afterwards, she may experience spotting intermittently. Patient verbalized understanding and gratitude. No further questions or concerns.

## 2014-08-11 ENCOUNTER — Telehealth: Payer: Self-pay | Admitting: General Practice

## 2014-08-11 NOTE — Telephone Encounter (Signed)
Patient called and left message stating she has nexplanon for birth control and it is making her bleed constantly for like 3 weeks then it will stop and she will start spotting. States she has had this for 4 months and just wants to change birth control methods. Called patient back and discussed that it is normal to have irregular bleeding during the first 6 months of this birth control but for some people that irregular heavy bleeding does not go away. Patient verbalized understanding. Told patient we can get her appt to come in and see someone to discuss her bleeding and options if she would like. Patient verbalized understanding and states yes please. Told patient our office will be in touch with her with that appt. Patient verbalized understanding and had no other questions

## 2014-08-16 ENCOUNTER — Encounter: Payer: Self-pay | Admitting: Family Medicine

## 2014-09-16 ENCOUNTER — Ambulatory Visit: Payer: Medicaid Other | Admitting: Family Medicine

## 2015-02-15 ENCOUNTER — Telehealth: Payer: Self-pay | Admitting: *Deleted

## 2015-02-15 NOTE — Telephone Encounter (Signed)
Pt contacted clinic, pt states she is on nexplanon and gets BV infection/yeast infection before period and 2 wks following period, pt gets another BV and yeast infection.  Contacted patient, pt states she has BV and severe yeast infections. States she needs to be seen and believes they are coming from the nexplanon birth control.   Offered patient an appointment within the a couple of days, pt states she needs to see provider today for treatment due to symptoms.  Encouraged patient to seek care at urgent care or MAU at [facility]. Pt verbalizes understanding.

## 2015-03-17 ENCOUNTER — Emergency Department (INDEPENDENT_AMBULATORY_CARE_PROVIDER_SITE_OTHER)
Admission: EM | Admit: 2015-03-17 | Discharge: 2015-03-17 | Disposition: A | Discharge status: Home / Self Care | Payer: Self-pay | Source: Home / Self Care | Attending: Family Medicine | Admitting: Family Medicine

## 2015-03-17 ENCOUNTER — Encounter (HOSPITAL_COMMUNITY): Payer: Self-pay | Admitting: *Deleted

## 2015-03-17 ENCOUNTER — Other Ambulatory Visit (HOSPITAL_COMMUNITY)
Admission: RE | Admit: 2015-03-17 | Discharge: 2015-03-17 | Disposition: A | Discharge status: Home / Self Care | Payer: Self-pay | Source: Ambulatory Visit | Attending: Family Medicine | Admitting: Family Medicine

## 2015-03-17 DIAGNOSIS — N760 Acute vaginitis: Secondary | ICD-10-CM

## 2015-03-17 DIAGNOSIS — Z113 Encounter for screening for infections with a predominantly sexual mode of transmission: Secondary | ICD-10-CM | POA: Insufficient documentation

## 2015-03-17 LAB — POCT URINALYSIS DIP (DEVICE)
Bilirubin Urine: NEGATIVE
GLUCOSE, UA: NEGATIVE mg/dL
HGB URINE DIPSTICK: NEGATIVE
Ketones, ur: NEGATIVE mg/dL
Nitrite: NEGATIVE
Protein, ur: NEGATIVE mg/dL
Specific Gravity, Urine: 1.025 (ref 1.005–1.030)
UROBILINOGEN UA: 1 mg/dL (ref 0.0–1.0)
pH: 6 (ref 5.0–8.0)

## 2015-03-17 LAB — POCT PREGNANCY, URINE: Preg Test, Ur: NEGATIVE

## 2015-03-17 MED ORDER — METRONIDAZOLE 0.75 % VA GEL: 1.0000 | Freq: Every day | VAGINAL | Status: DC

## 2015-03-17 NOTE — ED Notes (Signed)
Phone  Number verified

## 2015-03-17 NOTE — Discharge Instructions (Signed)
Use medicine as prescribed, we will call if tests show a need for other treatment.  

## 2015-03-17 NOTE — ED Provider Notes (Signed)
CSN: [id number]     Arrival date & time 03/17/15  1453 History   First MD Initiated Contact with Patient 03/17/15 1534     Chief Complaint  Patient presents with  . Vaginal Discharge   (Consider location/radiation/quality/duration/timing/severity/associated sxs/prior Treatment) Patient is a 19 y.o. female presenting with vaginal discharge. The history is provided by the patient.  Vaginal Discharge Quality:  Malodorous and white Severity:  Mild Onset quality:  Gradual Duration:  2 weeks Progression:  Worsening Chronicity:  New Context: spontaneously   Context comment:  Pt relates onset after starting bcp 2wks ago. Relieved by:  None tried Worsened by:  Nothing tried Ineffective treatments:  None tried Associated symptoms: no dysuria, no fever and no genital lesions     Past Medical History  Diagnosis Date  . Ringworm    History reviewed. No pertinent past surgical history. History reviewed. No pertinent family history. Social History  Substance Use Topics  . Smoking status: Never Smoker   . Smokeless tobacco: Never Used  . Alcohol Use: No   OB History    Gravida Para Term Preterm AB TAB SAB Ectopic Multiple Living   Review of Systems  Constitutional: Negative.  Negative for fever.  Genitourinary: Positive for vaginal discharge and menstrual problem. Negative for dysuria, urgency, frequency, flank pain, vaginal bleeding and pelvic pain.    Allergies  Other  Home Medications   Prior to Admission medications   Medication Sig Start Date End Date Taking? Authorizing Provider  acetaminophen (TYLENOL) 325 MG tablet Take 650 mg by mouth every 6 (six) hours as needed for headache.    Historical Provider, MD  cyclobenzaprine (FLEXERIL) 5 MG tablet Take 1 tablet (5 mg total) by mouth 3 (three) times daily as needed for muscle spasms. 04/29/14   Fredirick Lathe, MD  estradiol (ESTRACE) 0.5 MG tablet Take 1 tablet (0.5 mg total) by mouth daily. 07/15/14   Willodean Rosenthal, MD  ibuprofen (ADVIL,MOTRIN) 600 MG tablet Take 1 tablet (600 mg total) by mouth every 6 (six) hours. Patient not taking: Reported on 06/09/2014 04/28/14   Ardith Dark, MD  metroNIDAZOLE (METROGEL VAGINAL) 0.75 % vaginal gel Place 1 Applicatorful vaginally at bedtime. For 5 nights 03/17/15   Linna Hoff, MD  nystatin cream (MYCOSTATIN) Apply 1 application topically 2 (two) times daily. 06/09/14   Levie Heritage, DO  Prenatal Vit-Fe Fumarate-FA (PRENATAL MULTIVITAMIN) TABS tablet Take 1 tablet by mouth daily at 12 noon.    Historical Provider, MD   Meds Ordered and Administered this Visit  Medications - No data to display  BP 117/69 mmHg  Pulse 83  Temp(Src) 98.3 F (36.8 C) (Oral)  Resp 16  SpO2 100%  LMP 03/10/2015 No data found.   Physical Exam  Constitutional: She is oriented to person, place, and time. She appears well-developed and well-nourished. No distress.  Abdominal: Soft. Bowel sounds are normal.  Genitourinary: Vagina normal. No vaginal discharge found.  Neurological: She is alert and oriented to person, place, and time.  Skin: Skin is warm and dry.  Nursing note and vitals reviewed.   ED Course  Procedures (including critical care time)  Labs Review Labs Reviewed  POCT URINALYSIS DIP (DEVICE) - Abnormal; Notable for the following:    Leukocytes, UA TRACE (*)    All other components within normal limits  HIV ANTIBODY (ROUTINE TESTING)  POCT PREGNANCY, URINE  CERVICOVAGINAL ANCILLARY ONLY  Imaging Review No results found.   Visual Acuity Review  Right Eye Distance:   Left Eye Distance:   Bilateral Distance:    Right Eye Near:   Left Eye Near:    Bilateral Near:         MDM   1. Vaginitis and vulvovaginitis        Linna Hoff, MD 03/17/15 [phone]

## 2015-03-17 NOTE — ED Notes (Signed)
Pt     Reports     Vaginal  Discharge      With  Symptoms  X  2  Weeks     She  Reports  A  Foul  Odor  As    Well

## 2015-03-18 LAB — HIV ANTIBODY (ROUTINE TESTING W REFLEX): HIV SCREEN 4TH GENERATION: NONREACTIVE

## 2015-03-18 LAB — CERVICOVAGINAL ANCILLARY ONLY
CHLAMYDIA, DNA PROBE: POSITIVE — AB
Neisseria Gonorrhea: NEGATIVE

## 2015-03-21 LAB — CERVICOVAGINAL ANCILLARY ONLY: Wet Prep (BD Affirm): POSITIVE — AB

## 2015-03-22 NOTE — ED Notes (Addendum)
Called and left detailed VM for patient to call us regarding her recent visit to [facility]. Discussed w Dr Randal Buba, who authorized Rx 1 Gram Azithromycin as a one time dose to be called in to the pharmacy of her choice

## 2015-03-23 MED ORDER — AZITHROMYCIN 250 MG PO TABS: 1000.0000 mg | ORAL_TABLET | Freq: Once | ORAL | Status: DC

## 2015-03-23 NOTE — ED Provider Notes (Addendum)
Patient returned call. Identified by name, date of birth, and social. Informed her that her testing was positive for BV which she has been adequately treated for with MetroGel. She was positive for chlamydia. Azithromycin 1 g by mouth was sent to her pharmacy. Discussed no sex for 1 week. Discussed that she needs to notify her sexual partners.  Recommended follow-up at the health department in one month for retesting. All questions answered.  Charm Rings, MD 03/23/15 1610  Charm Rings, MD 03/23/15 [phone]

## 2015-03-24 NOTE — ED Notes (Signed)
Preturned call, discussed labs w Dr Piedad Climes 9-14, Rx called to pharmacy of choice. DHHS form 2124 completed and faxed to [facility] for their records

## 2015-07-09 IMAGING — US US OB FOLLOW-UP
1 series · 12 of 28 positions shown · non-contrast
Comparison: none

[Series 1: us ob follow up · 66 acquisitions, 12 frames shown]
[im 3/66]
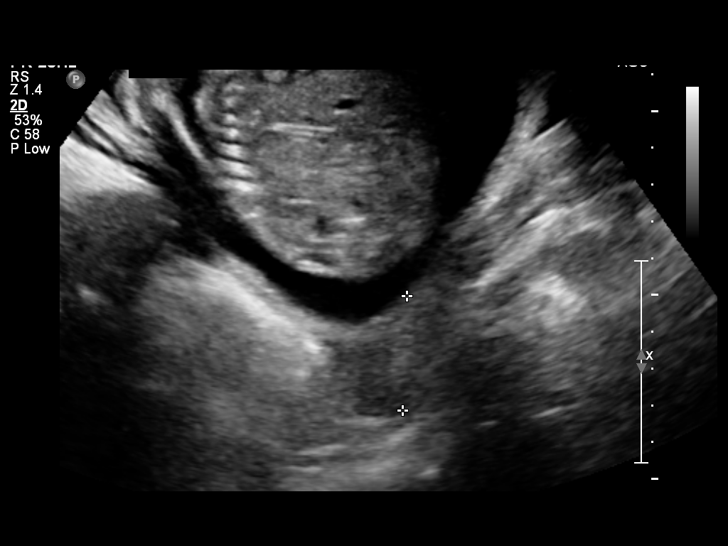
[im 8/66]
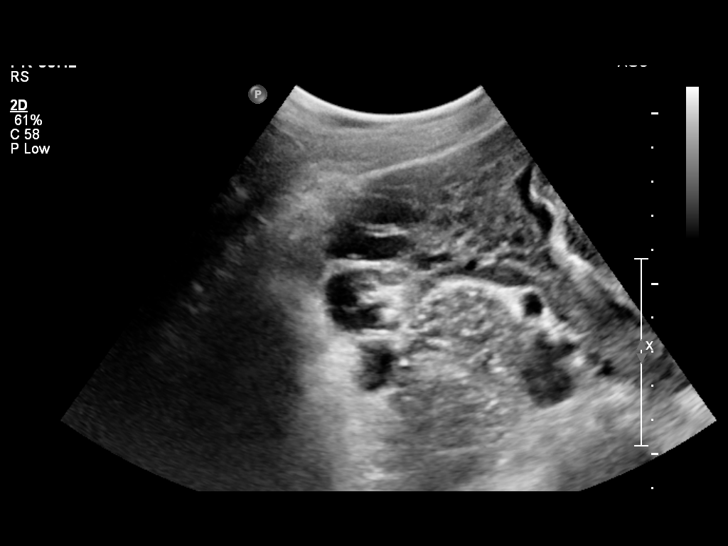
[im 13/66]
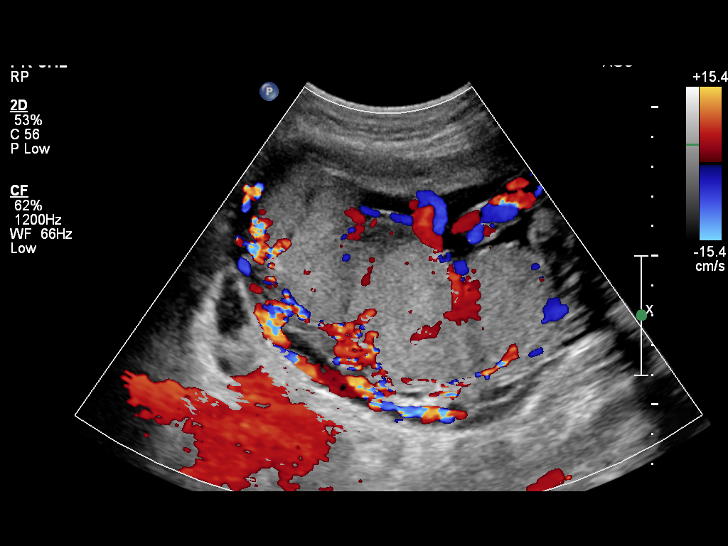
[im 20/66]
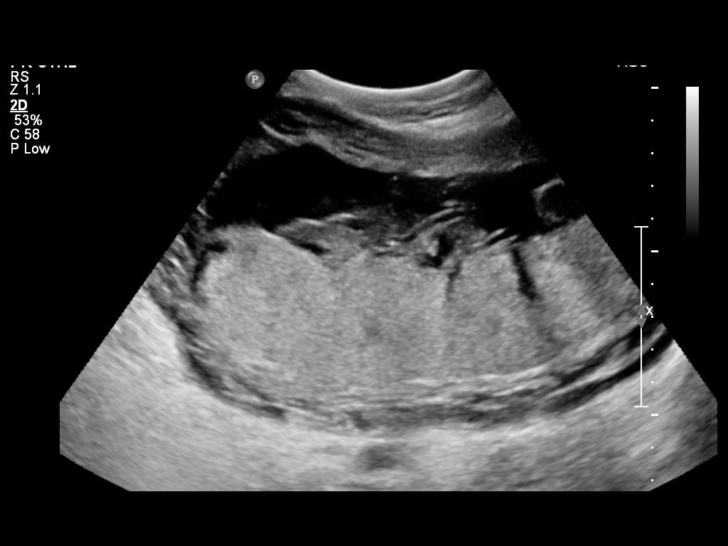
[im 25/66]
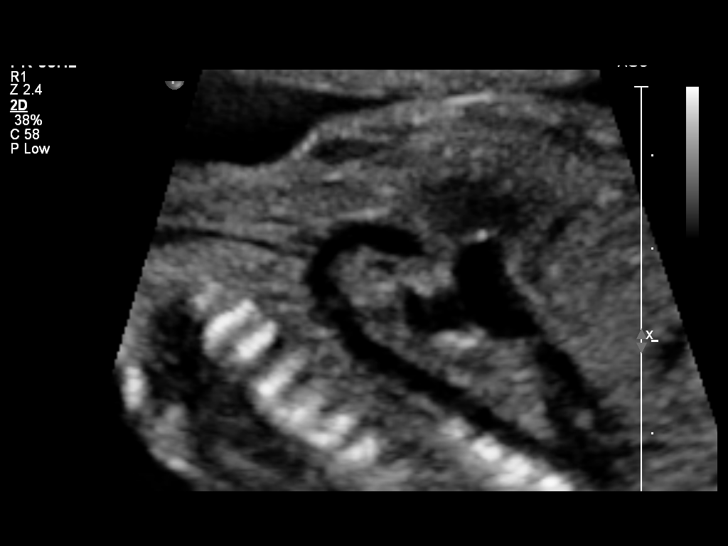
[im 29/66]
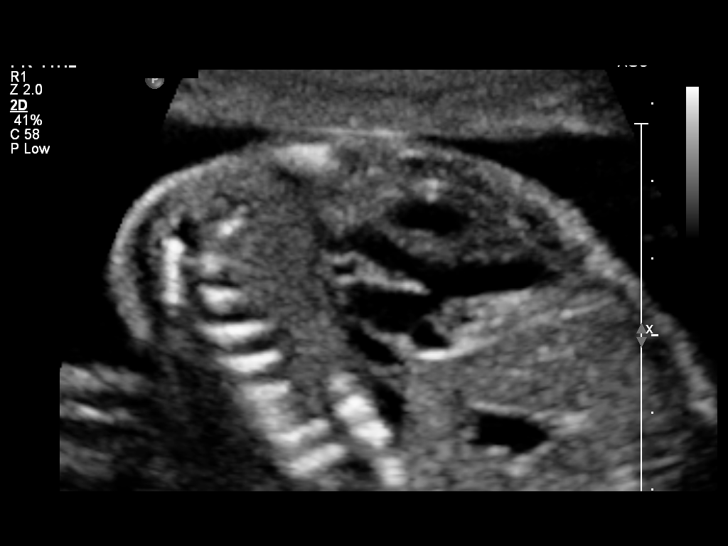
[im 37/66]
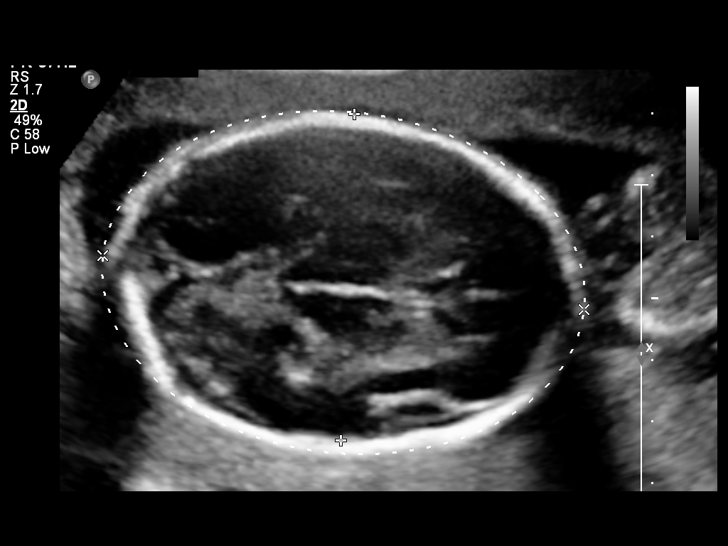
[im 41/66]
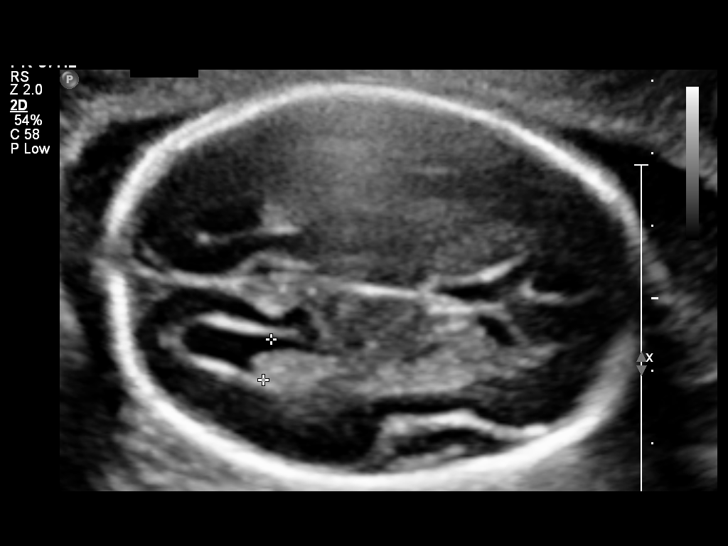
[im 46/66]
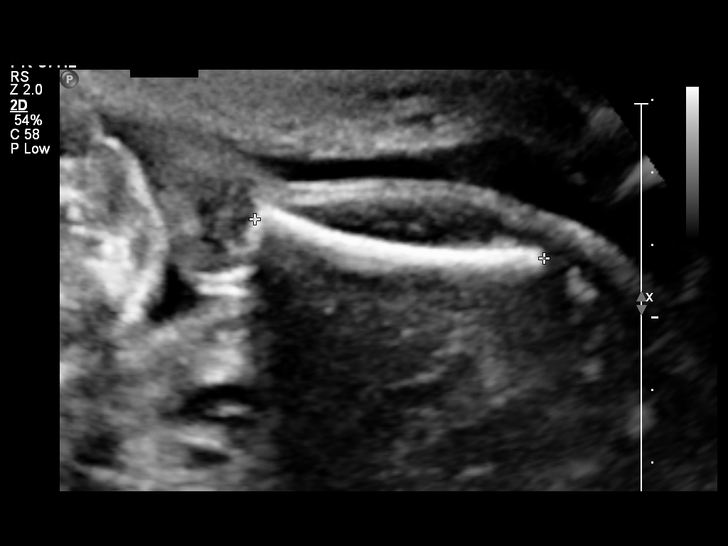
[im 53/66]
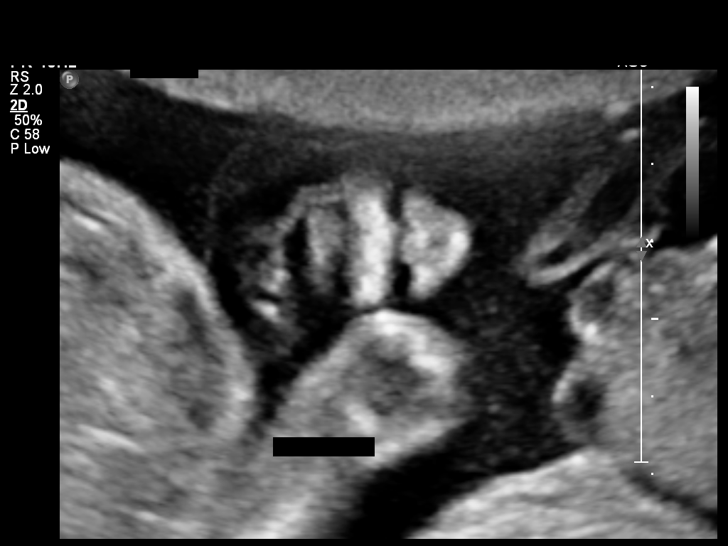
[im 58/66]
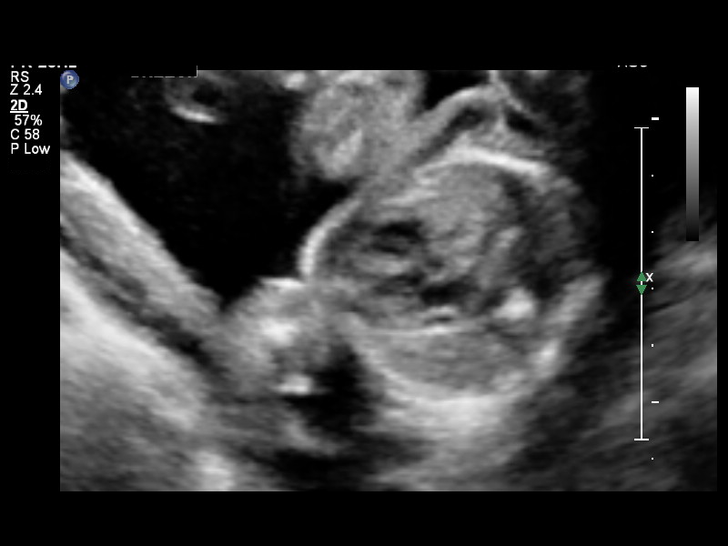
[im 63/66]
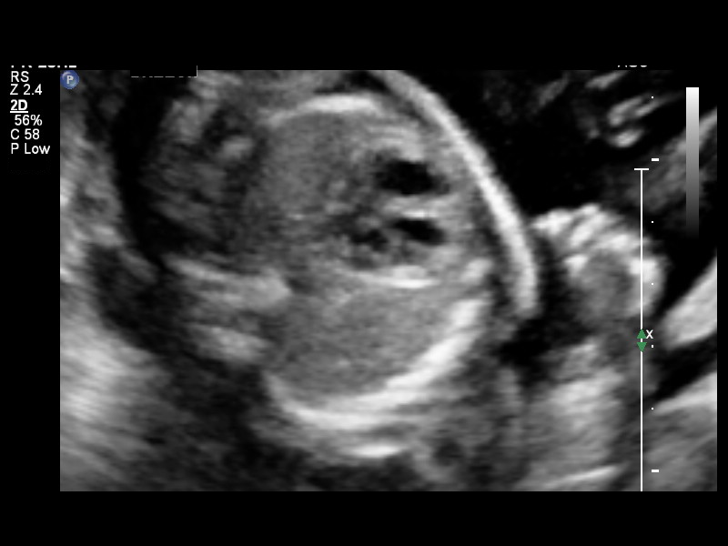

[12 of 28 positions shown; findings below may reference images not displayed]

OBSTETRICS REPORT
                      (Signed Final 01/21/2014 [DATE])

Service(s) Provided

 US OB FOLLOW UP                                       76816.1
Indications

 Follow-up incomplete fetal anatomic evaluation
Fetal Evaluation

 Num Of Fetuses:    1
 Fetal Heart Rate:  160                          bpm
 Cardiac Activity:  Observed
 Presentation:      Breech
 Placenta:          Posterior, above cervical
                    os

 Amniotic Fluid
 AFI FV:      Subjectively within normal limits
                                             Larg Pckt:    5.14  cm
Biometry

 BPD:     53.4  mm     G. Age:  22w 2d                CI:        66.24   70 - 86
                                                      FL/HC:      19.1   19.2 -

 HC:     210.4  mm     G. Age:  23w 1d       23  %    HC/AC:      1.13   1.05 -

 AC:     186.9  mm     G. Age:  23w 3d       43  %    FL/BPD:     75.1   71 - 87
 FL:      40.1  mm     G. Age:  23w 0d       24  %    FL/AC:      21.5   20 - 24
 HUM:     39.5  mm     G. Age:  24w 1d       57  %

 Est. FW:     570  gm      1 lb 4 oz     48  %
Gestational Age

 LMP:           23w 3d        Date:  08/10/13                 EDD:   05/17/14
 U/S Today:     23w 0d                                        EDD:   05/20/14
 Best:          23w 3d     Det. By:  LMP  (08/10/13)          EDD:   05/17/14
Anatomy

 Cranium:          Appears normal         Aortic Arch:      Appears normal
 Fetal Cavum:      Previously seen        Ductal Arch:      Appears normal
 Ventricles:       Appears normal         Diaphragm:        Previously seen
 Choroid Plexus:   Previously seen        Stomach:          Appears normal, left
                                                            sided
 Cerebellum:       Previously seen        Abdomen:          Appears normal
 Posterior Fossa:  Previously seen        Abdominal Wall:   Previously seen
 Nuchal Fold:      Not applicable (>20    Cord Vessels:     Previously seen
                   wks GA)
 Face:             Orbits and profile     Kidneys:          Appear normal
                   previously seen
 Lips:             Appears normal         Bladder:          Appears normal
 Heart:            Limited views but      Spine:            Previously seen
                   appeared normal
 RVOT:             Not well visualized    Lower             Previously seen
                                          Extremities:
 LVOT:             Appears normal         Upper             Previously seen
                                          Extremities:

 Other:  Heels and 5th digit previously visualized. Nasal bone previously
         visualized. Technically difficult due to fetal position. Gender not well
         visualized. Fetus appears to be a female.
Targeted Anatomy

 Fetal Central Nervous System
 Lat. Ventricles:
Cervix Uterus Adnexa

 Cervical Length:    3.1      cm

 Cervix:       Normal appearance by transabdominal scan.
 Uterus:       No abnormality visualized.
 Left Ovary:    Size(cm) L: 2.32 x W: 1.25 x H: 1.12  Volume(cc):
 Right Ovary:   Not visualized.
 Adnexa:     No abnormality visualized. No adnexal mass visualized.
Impression

 SIUP at 23+3 weeks
 Normal interval anatomy; anatomic survey complete; limited
 views of RVOT and 4-chamber (grossly appeared to be
 normal); great vessels were visualized crossing
 Normal amniotic fluid volume
 Appropriate interval growth with EFW at the 48th %tile
Recommendations

 Follow-up as clinically indicated or could bring back for heart
 views

 questions or concerns.

## 2015-11-27 IMAGING — US US TRANSVAGINAL NON-OB
1 series · 14 of 25 positions shown · non-contrast
Comparison: None

CLINICAL DATA: Vaginal bleeding.  Six weeks postpartum.

EXAM:
TRANSABDOMINAL AND TRANSVAGINAL ULTRASOUND OF PELVIS
TECHNIQUE: Both transabdominal and transvaginal ultrasound examinations of the
pelvis were performed. Transabdominal technique was performed for
global imaging of the pelvis including uterus, ovaries, adnexal
regions, and pelvic cul-de-sac. It was necessary to proceed with
endovaginal exam following the transabdominal exam to visualize the
endometrial stripe and ovaries.

[Series 1: us transvaginal non-ob · 14 of 56 slices shown]
[im 1/56]
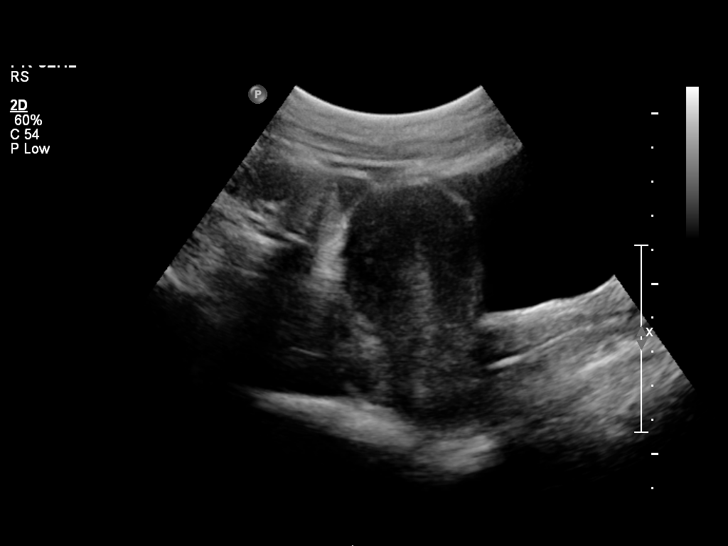
[im 5/56]
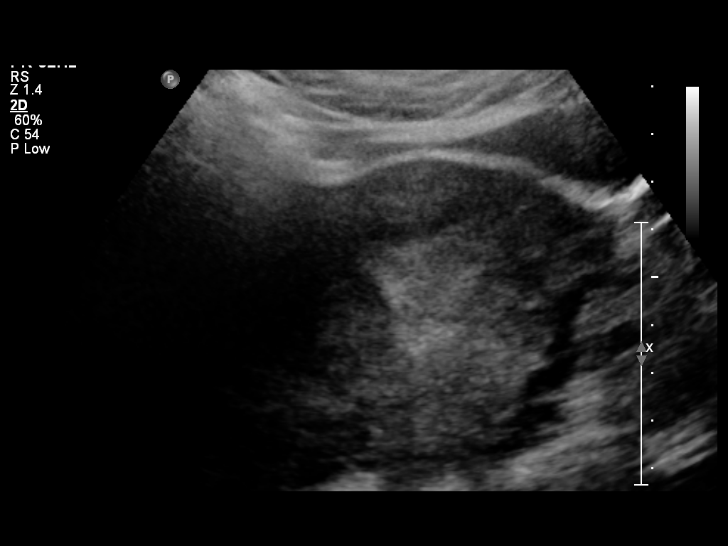
[im 10/56]
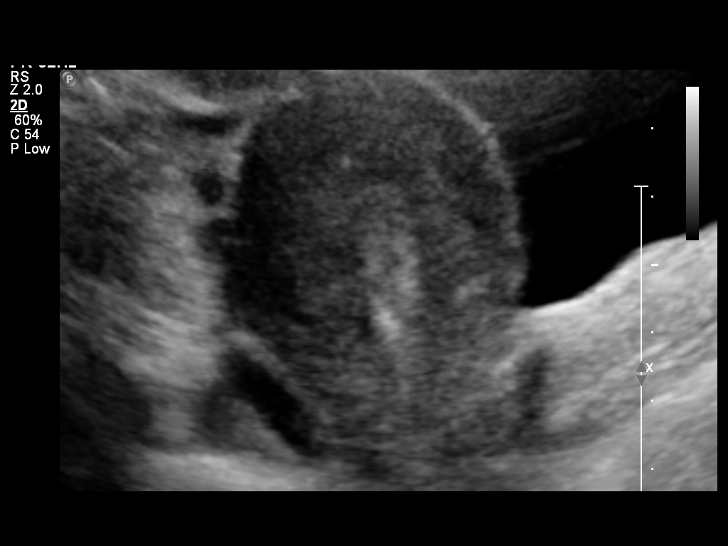
[im 14/56]
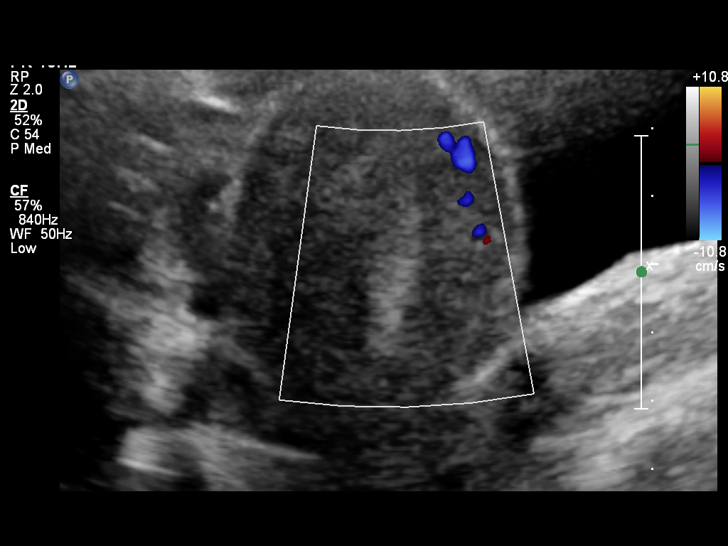
[im 19/56]
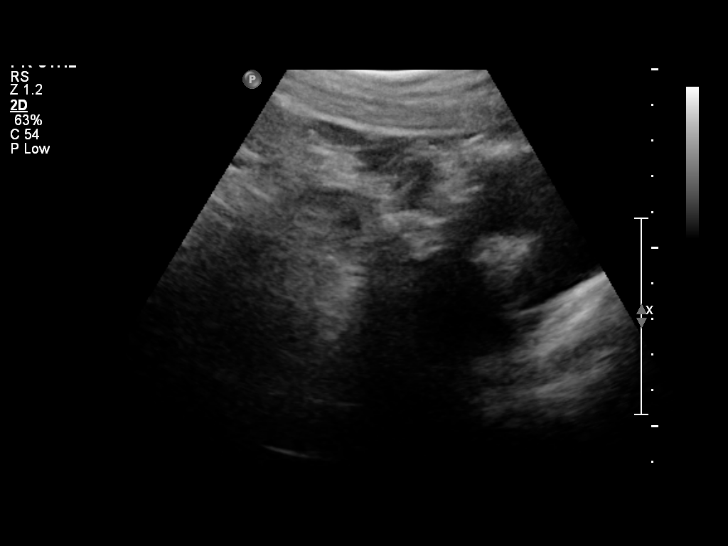
[im 21/56]
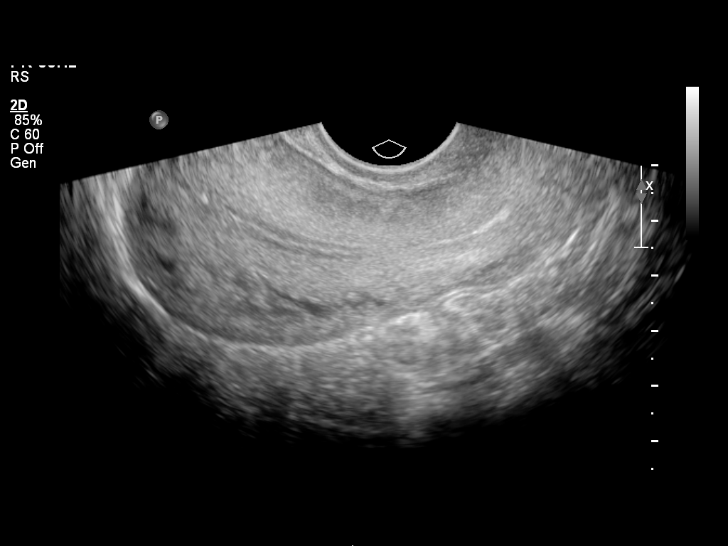
[im 26/56]
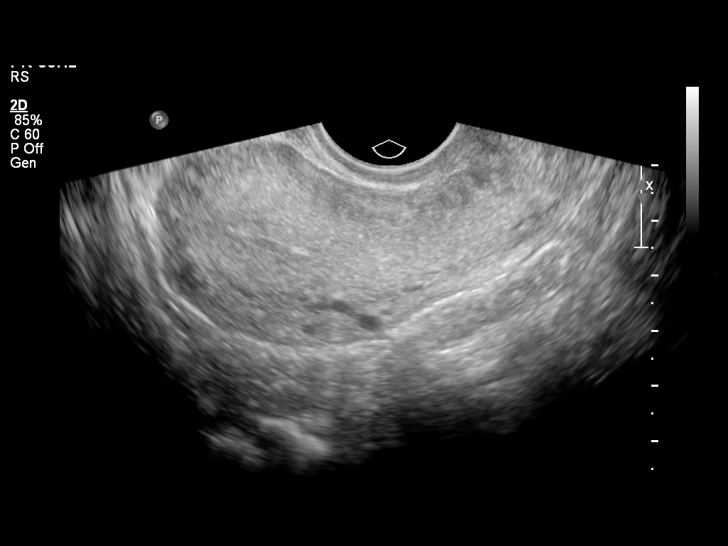
[im 30/56]
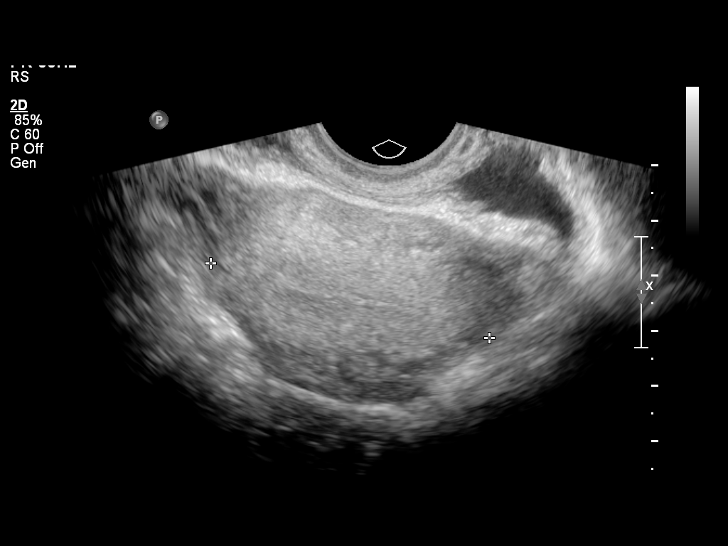
[im 35/56]
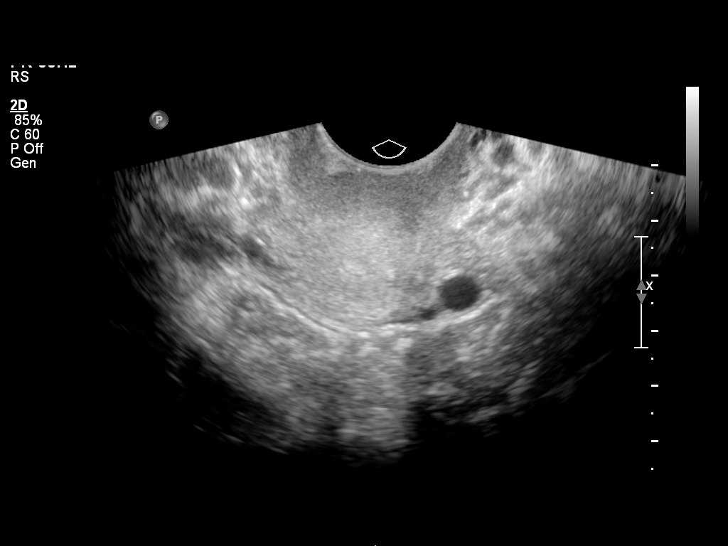
[im 37/56]
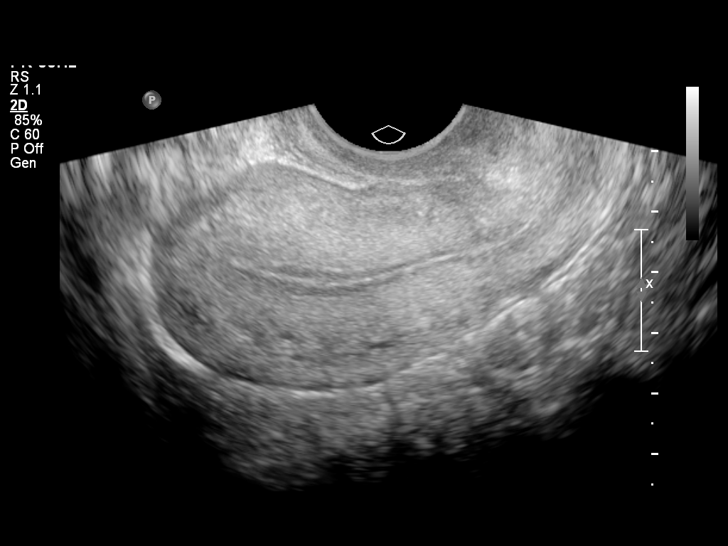
[im 42/56]
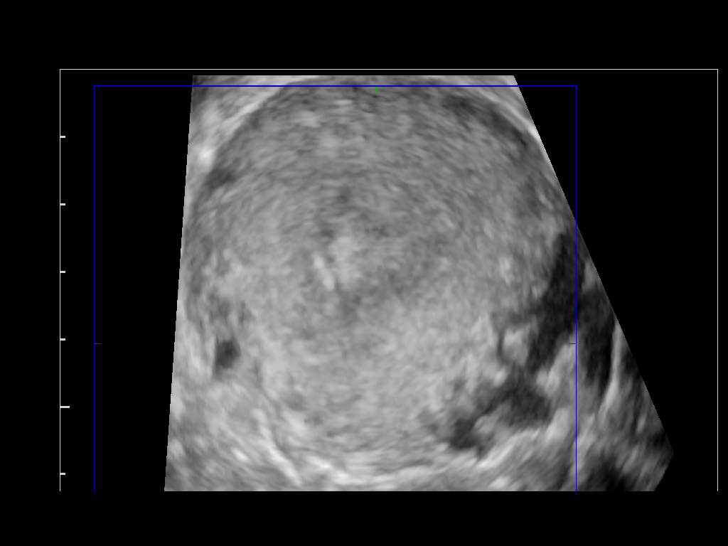
[im 46/56]
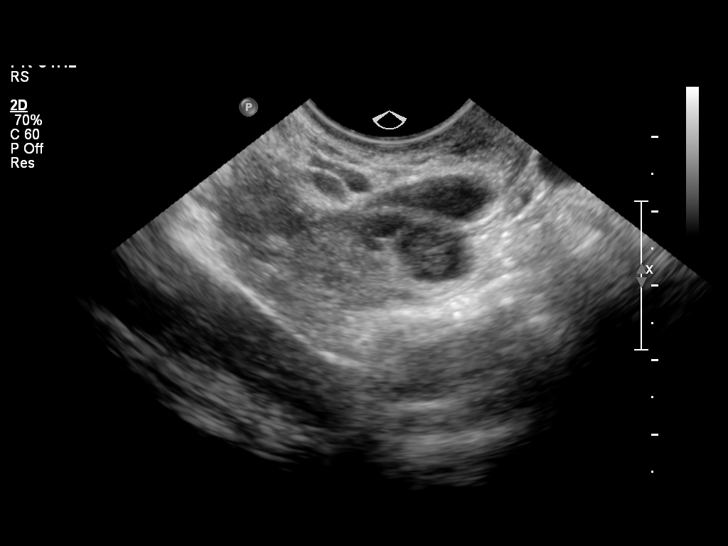
[im 51/56]
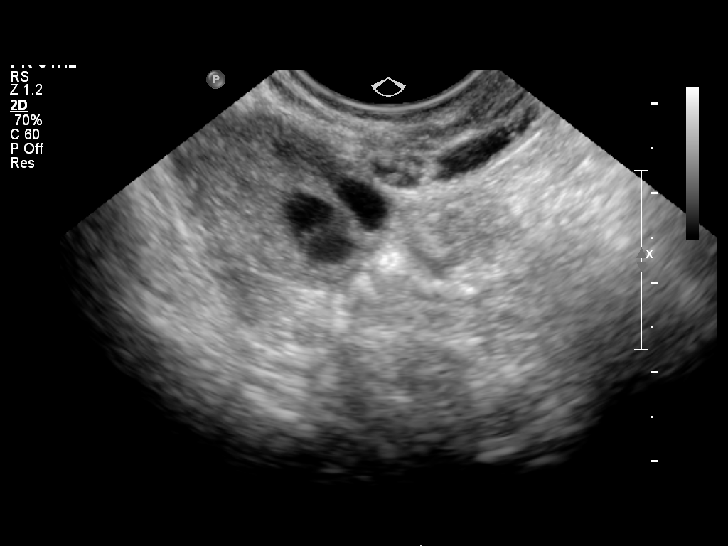
[im 56/56]
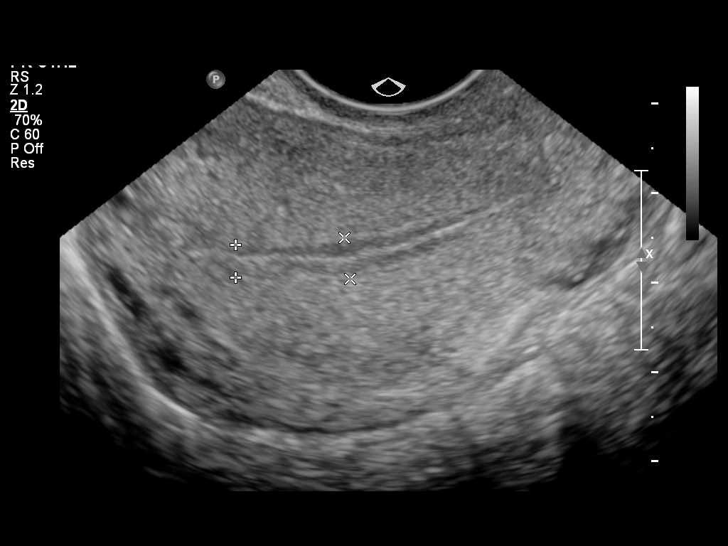

[14 of 25 positions shown; findings below may reference images not displayed]

FINDINGS: Uterus

Measurements: 8.2 x 3.8 x 5.2 cm. No fibroids or other mass
visualized.

Endometrium

Thickness: 5 mm. Tri-layered appearance. No focal abnormality
visualized.

Right ovary

Measurements: 2.5 x 1.5 x 1.7 cm. Normal appearance/no adnexal mass.

Left ovary

Measurements: 2.7 x 1.2 x 1.6 cm. Normal appearance/no adnexal mass.

Other findings

No free fluid.
IMPRESSION: Normal appearance of uterus, with thin endometrial stripe measuring
5 mm.

Normal appearance of both ovaries.

## 2016-06-30 ENCOUNTER — Encounter (HOSPITAL_COMMUNITY): Payer: Self-pay

## 2016-06-30 ENCOUNTER — Emergency Department (HOSPITAL_COMMUNITY)
Admission: EM | Admit: 2016-06-30 | Discharge: 2016-06-30 | Disposition: A | Discharge status: Home / Self Care | Payer: Self-pay | Attending: Emergency Medicine | Admitting: Emergency Medicine

## 2016-06-30 DIAGNOSIS — K047 Periapical abscess without sinus: Secondary | ICD-10-CM | POA: Insufficient documentation

## 2016-06-30 DIAGNOSIS — Z79899 Other long term (current) drug therapy: Secondary | ICD-10-CM | POA: Insufficient documentation

## 2016-06-30 MED ORDER — TRAMADOL HCL 50 MG PO TABS: 50.0000 mg | ORAL_TABLET | Freq: Four times a day (QID) | ORAL | 0 refills | Status: DC | PRN

## 2016-06-30 MED ORDER — IBUPROFEN 800 MG PO TABS: 800.0000 mg | ORAL_TABLET | Freq: Three times a day (TID) | ORAL | 0 refills | Status: DC | PRN

## 2016-06-30 MED ORDER — PENICILLIN V POTASSIUM 500 MG PO TABS: 500.0000 mg | ORAL_TABLET | Freq: Four times a day (QID) | ORAL | 0 refills | Status: DC

## 2016-06-30 NOTE — ED Triage Notes (Signed)
Patient complains of right lower dental pain with gum swelling x 1 day. Patient thinks related to wisdom tooth

## 2016-06-30 NOTE — Discharge Instructions (Signed)
Return here as needed.  Followup with the dentist provided. rinse with warm water and peroxide 3 times a day °

## 2016-06-30 NOTE — ED Provider Notes (Signed)
[facility]-EMERGENCY DEPT Provider Note   CSN: [id number] Arrival date & time: 06/30/16  0935  By signing my name below, I, Octavia Heirrianna Nassar, attest that this documentation has been prepared under the direction and in the presence of Eli Lilly and CompanyChristopher Luwanna Brossman, PA-C.  Electronically Signed: Octavia HeirArianna Nassar, ED Scribe. 06/30/16. 10:10 AM.     History   Chief Complaint Chief Complaint  Patient presents with  . wisdom tooth pain/ gum swelling   The history is provided by the patient. No language interpreter was used.   HPI Comments: Tabitha Case is a 20 y.o. female who presents to the Emergency Department complaining of sudden onset, gradual worsening, moderate left lower dental pain x 1 day. She has associated left lower facial swelling and pain that radiates below her jaw. Pt states that her pain is related to her wisdom teeth coming in. She has not taken any medication to relieve her pain. Pt has not seen a dentist for her dental problem. She denies fever or chills.  Past Medical History:  Diagnosis Date  . Ringworm     Patient Active Problem List   Diagnosis Date Noted  . Active labor 04/26/2014  . High risk teen pregnancy in third trimester 04/14/2014  . Supervision of low-risk pregnancy 12/09/2013  . Supervision of normal pregnancy 11/18/2013  . Tinea corporis 11/18/2013    History reviewed. No pertinent surgical history.  OB History    Gravida Para Term Preterm AB Living   2 2 2     2    SAB TAB Ectopic Multiple Live Births           2       Home Medications    Prior to Admission medications   Medication Sig Start Date End Date Taking? Authorizing Provider  acetaminophen (TYLENOL) 325 MG tablet Take 650 mg by mouth every 6 (six) hours as needed for headache.    Historical Provider, MD  azithromycin (ZITHROMAX Z-PAK) 250 MG tablet Take 4 tablets (1,000 mg total) by mouth once. Take 2 pills today, then 1 pill daily until gone. 03/23/15   Charm RingsErin J Honig, MD  cyclobenzaprine  (FLEXERIL) 5 MG tablet Take 1 tablet (5 mg total) by mouth 3 (three) times daily as needed for muscle spasms. 04/29/14   Fredirick LatheKristy Acosta, MD  estradiol (ESTRACE) 0.5 MG tablet Take 1 tablet (0.5 mg total) by mouth daily. 07/15/14   Willodean Rosenthalarolyn Harraway-Smith, MD  ibuprofen (ADVIL,MOTRIN) 600 MG tablet Take 1 tablet (600 mg total) by mouth every 6 (six) hours. Patient not taking: Reported on 06/09/2014 04/28/14   Ardith Darkaleb M Parker, MD  metroNIDAZOLE (METROGEL VAGINAL) 0.75 % vaginal gel Place 1 Applicatorful vaginally at bedtime. For 5 nights 03/17/15   Linna HoffJames D Kindl, MD  nystatin cream (MYCOSTATIN) Apply 1 application topically 2 (two) times daily. 06/09/14   Levie HeritageJacob J Stinson, DO  Prenatal Vit-Fe Fumarate-FA (PRENATAL MULTIVITAMIN) TABS tablet Take 1 tablet by mouth daily at 12 noon.    Historical Provider, MD    Family History No family history on file.  Social History Social History  Substance Use Topics  . Smoking status: Never Smoker  . Smokeless tobacco: Never Used  . Alcohol use No     Allergies   Other   Review of Systems Review of Systems  A complete 10 system review of systems was obtained and all systems are negative except as noted in the HPI and PMH.   Physical Exam Updated Vital Signs BP 120/74 (BP Location: Right Arm)  Pulse 62   Temp 98.3 F (36.8 C) (Oral)   Resp 16   SpO2 100%   Physical Exam  Constitutional: She is oriented to person, place, and time. She appears well-developed and well-nourished.  HENT:  Head: Normocephalic.  Swelling around the tissue of left lower molars  Eyes: EOM are normal.  Neck: Normal range of motion.  Pulmonary/Chest: Effort normal.  Abdominal: She exhibits no distension.  Musculoskeletal: Normal range of motion.  Neurological: She is alert and oriented to person, place, and time.  Psychiatric: She has a normal mood and affect.  Nursing note and vitals reviewed.    ED Treatments / Results  DIAGNOSTIC STUDIES: Oxygen Saturation is  100% on RA, normal by my interpretation.  COORDINATION OF CARE: 10:09 AM Discussed treatment plan with pt at bedside and pt agreed to plan.  Labs (all labs ordered are listed, but only abnormal results are displayed) Labs Reviewed - No data to display  EKG  EKG Interpretation None       Radiology No results found.  Procedures Procedures (including critical care time)  Medications Ordered in ED Medications - No data to display   Initial Impression / Assessment and Plan / ED Course  I have reviewed the triage vital signs and the nursing notes.  Pertinent labs & imaging results that were available during my care of the patient were reviewed by me and considered in my medical decision making (see chart for details).  Clinical Course     Patient be referred to dentistry.  Told to return here as needed.  I advised her to rinse with warm water and peroxide 3 times a day  Final Clinical Impressions(s) / ED Diagnoses   Final diagnoses:  None   I personally performed the services described in this documentation, which was scribed in my presence. The recorded information has been reviewed and is accurate.  New Prescriptions New Prescriptions   No medications on file     Charlestine NightChristopher Becca Bayne, PA-C 06/30/16 1035    Gerhard Munchobert Lockwood, MD 06/30/16 [phone]

## 2016-06-30 NOTE — ED Notes (Signed)
Declined W/C at D/C and was escorted to lobby by RN. 

## 2017-01-01 ENCOUNTER — Encounter: Payer: Self-pay | Admitting: Family Medicine

## 2017-01-01 ENCOUNTER — Other Ambulatory Visit (HOSPITAL_COMMUNITY)
Admission: RE | Admit: 2017-01-01 | Discharge: 2017-01-01 | Disposition: A | Discharge status: Home / Self Care | Payer: Medicaid Other | Source: Ambulatory Visit | Attending: Family Medicine | Admitting: Family Medicine

## 2017-01-01 ENCOUNTER — Ambulatory Visit (INDEPENDENT_AMBULATORY_CARE_PROVIDER_SITE_OTHER): Payer: Medicaid Other | Admitting: Family Medicine

## 2017-01-01 VITALS — BP 115/75 | HR 102 | Wt 176.0 lb

## 2017-01-01 DIAGNOSIS — O219 Vomiting of pregnancy, unspecified: Secondary | ICD-10-CM

## 2017-01-01 DIAGNOSIS — Z3689 Encounter for other specified antenatal screening: Secondary | ICD-10-CM | POA: Diagnosis not present

## 2017-01-01 DIAGNOSIS — Z3481 Encounter for supervision of other normal pregnancy, first trimester: Secondary | ICD-10-CM | POA: Insufficient documentation

## 2017-01-01 DIAGNOSIS — Z3A10 10 weeks gestation of pregnancy: Secondary | ICD-10-CM | POA: Diagnosis not present

## 2017-01-01 DIAGNOSIS — Z113 Encounter for screening for infections with a predominantly sexual mode of transmission: Secondary | ICD-10-CM

## 2017-01-01 DIAGNOSIS — Z3480 Encounter for supervision of other normal pregnancy, unspecified trimester: Secondary | ICD-10-CM | POA: Insufficient documentation

## 2017-01-01 MED ORDER — DOXYLAMINE-PYRIDOXINE ER 20-20 MG PO TBCR: 1.0000 | EXTENDED_RELEASE_TABLET | Freq: Two times a day (BID) | ORAL | 3 refills | Status: DC

## 2017-01-01 NOTE — Progress Notes (Signed)
   Subjective:    Tabitha Case is a [id number] [id number] being seen today for her first obstetrical visit.  Her obstetrical history is not significant. Pregnancy history fully reviewed. Had pap and CPE with Nexplanon removal in 2/18.  Patient reports nausea and vomiting.  Vitals:   01/01/17 1611  BP: 115/75  Pulse: (!) 102  Weight: 176 lb (79.8 kg)    HISTORY: OB History  Gravida Para Term Preterm AB Living  3 2 2     2   SAB TAB Ectopic Multiple Live Births          2    # Outcome Date GA Lbr Len/2nd Weight Sex Delivery Anes PTL Lv  3 Current           2 Term 04/26/14 7824w0d 00:28 / 00:19 5 lb 10.1 oz (2.554 kg) F Vag-Spont EPI  LIV  1 Term 06/04/13 1850w6d 14:21 / 00:25 7 lb 0.5 oz (3.189 kg) F Vag-Spont None  LIV     Past Medical History:  Diagnosis Date  . Ringworm    Past Surgical History:  Procedure Laterality Date  . WISDOM TOOTH EXTRACTION     History reviewed. No pertinent family history.   Exam    Uterus:     Pelvic Exam:    Skin: normal coloration and turgor, no rashes    Neurologic: oriented   Extremities: normal strength, tone, and muscle mass   HEENT extra ocular movement intact and sclera clear, anicteric   Mouth/Teeth mucous membranes moist, pharynx normal without lesions and dental hygiene good   Neck supple   Cardiovascular: regular rate and rhythm   Respiratory:  appears well, vitals normal, no respiratory distress, acyanotic, normal RR, ear and throat exam is normal, neck free of mass or lymphadenopathy   Abdomen: soft, non-tender; bowel sounds normal; no masses,  no organomegaly      Assessment/Plan:    Pregnancy: G3P2002 1. Supervision of other normal pregnancy, antepartum Candidate for Baby Scripts-information given. - US MFM OB COMP + 14 WK; Future - Hemoglobin A1c - Obstetric Panel, Including HIV - Culture, OB Urine - Cervicovaginal ancillary only  2. Nausea and vomiting of pregnancy, antepartum Trial of Bonjesta Ok to continue  Dramamine - Doxylamine-Pyridoxine ER (BONJESTA) 20-20 MG TBCR; Take 1 tablet by mouth 2 (two) times daily.  Dispense: 60 tablet; Refill: 3   Reva Boresanya S Armonte Tortorella 01/01/2017

## 2017-01-01 NOTE — Patient Instructions (Signed)
Breastfeeding Twins or Multiples Choosing to breastfeed your babies has many benefits:  It helps your uterus return to its original size faster.  It releases hormones that relax you.  It saves money and time. Your milk is available at the right temperature and whenever your babies are ready to feed.  It ensures your babies get the best nutrition.  It creates a unique bond between you and each of your babies.  Breast milk is beneficial for all babies, but it is especially beneficial to multiples, who are often small at birth and need all the advantages breast milk can provide. Mothers of multiples typically produce enough milk for all their babies. When should I start breastfeeding? Nurse as soon as possible and as often as your babies want to be nursed. This will cause your body to produce enough milk for all your babies. Work with a Advertising copywriterlactation consultant as soon as possible. It is important to assess each infant at the breast to make sure they can latch and feed. If your babies were born prematurely and are unable to nurse, you can pump your breasts and freeze the milk until your babies are ready to feed at the breast. To make sure that your body makes enough milk, empty your breast at least 8-10 times in a 24-hour period. Ask a lactation specialist to help you choose an effective breast pump and for guidance in helping your babies latch on to and feed from the breast when they are ready. Should I nurse my babies together? It is up to you whether you nurse your babies together or separately. However, many mothers of multiples find that it is easier and time-saving to nurse two babies at the same time. Nursing babies together may also help establish your milk supply. Nursing two babies at the same time can be tricky at first, but it often gets easier as the babies get older and more experienced at latching on to the breast. When nursing your babies together:  Ask your nurse or lactation  specialist to suggest tips on positioning. There are several positions and holds that make it easier to nurse more than one baby at a time.  Try placing pillows under your arms and legs and under your babies for comfort. Use a nursing pillow specially designed for multiples.  Switch your babies from one side to the other at alternate feedings. For example, if baby A feeds from the right breast and baby B feeds from the left breast, then at the next feeding baby A should take the left breast and baby B the right breast. This ensures that both breasts get equal amounts of stimulation. It also allows the stronger sucking baby to increase the milk supply for the baby whose suck is weaker.  What are some tips to increase my success?  A good latch helps the babies empty the breasts. It also prevents sore nipples. If you are nursing babies together and one of the babies is having difficulty latching or sucking, try nursing that baby separately. That way you can give her or him your full attention.  If you are nursing babies separately and one of the babies is having difficulty feeding, it may help to nurse that baby and his or her sibling together. The baby with the stronger or more effective suck will stimulate the mother's milk to flow faster.  Try not to give your babies bottles and pacifiers during the early weeks of breastfeeding. Avoiding these encourages effective sucking patterns and  helps establish a good milk supply. You should not need supplemental feedings if you empty your breasts with each feeding.  Keep track of each baby's stools and wet diapers for the first 6 weeks to make sure each baby is getting enough milk. Signs that your babies are getting enough milk include: ? Wetting at least 1-2 diapers in the first 24 hours after birth. ? Wetting at least 5-6 diapers every 24 hours for the first week after birth. The urine should be clear and pale yellow by 5 days after birth. ? Wetting 6-8  diapers every 24 hours as your babies grow and develop. ? Producing a healthy amount of stool:  By the time your babies are 17 days old, they should produce at least 3 stools in a 24-hour period. The stools should be soft and yellow.  By the time your babies are 60 days old, they should produce at least 3 stools in a 24-hour period. The stools should be seedy and yellow. ? Gaining a healthy amount of weight. Talk to your health care provider about how much weight your babies should be gaining.  If your babies do not get enough milk from breastfeeding alone, talk with a lactation specialist. It may be possible to breastfeed part of the time and supplement feedings with donated milk or formula.  Drink plenty of fluids so your urine is clear or pale yellow.  Eat a healthy diet that includes fresh fruits and vegetables, whole grains, lean meat, fish, eggs, beans, nuts, and seeds, and low-fat dairy products. Summary  Breastfeeding is beneficial for twins, multiples, and their mothers.  Nurse your babies as soon as possible after birth. You may nurse two babies at one time.  Work with a lactation specialist to assess your babies' latches, find positioning and breastfeeding strategies that work for you, and overcome any nursing challenges. This information is not intended to replace advice given to you by your health care provider. Make sure you discuss any questions you have with your health care provider. Document Released: 10/23/2004 Document Revised: 06/27/2016 Document Reviewed: 06/27/2016 Elsevier Interactive Patient Education  2017 [employer]. First Trimester of Pregnancy The first trimester of pregnancy is from week 1 until the end of week 13 (months 1 through 3). During this time, your baby will begin to develop inside you. At 6-8 weeks, the eyes and face are formed, and the heartbeat can be seen on ultrasound. At the end of 12 weeks, all the baby's organs are formed. Prenatal care is all  the medical care you receive before the birth of your baby. Make sure you get good prenatal care and follow all of your doctor's instructions. Follow these instructions at home: Medicines  Take over-the-counter and prescription medicines only as told by your doctor. Some medicines are safe and some medicines are not safe during pregnancy.  Take a prenatal vitamin that contains at least 600 micrograms (mcg) of folic acid.  If you have trouble pooping (constipation), take medicine that will make your stool soft (stool softener) if your doctor approves. Eating and drinking  Eat regular, healthy meals.  Your doctor will tell you the amount of weight gain that is right for you.  Avoid raw meat and uncooked cheese.  If you feel sick to your stomach (nauseous) or throw up (vomit): ? Eat 4 or 5 small meals a day instead of 3 large meals. ? Try eating a few soda crackers. ? Drink liquids between meals instead of during meals.  To prevent constipation: ? Eat foods that are high in fiber, like fresh fruits and vegetables, whole grains, and beans. ? Drink enough fluids to keep your pee (urine) clear or pale yellow. Activity  Exercise only as told by your doctor. Stop exercising if you have cramps or pain in your lower belly (abdomen) or low back.  Do not exercise if it is too hot, too humid, or if you are in a place of great height (high altitude).  Try to avoid standing for long periods of time. Move your legs often if you must stand in one place for a long time.  Avoid heavy lifting.  Wear low-heeled shoes. Sit and stand up straight.  You can have sex unless your doctor tells you not to. Relieving pain and discomfort  Wear a good support bra if your breasts are sore.  Take warm water baths (sitz baths) to soothe pain or discomfort caused by hemorrhoids. Use hemorrhoid cream if your doctor says it is okay.  Rest with your legs raised if you have leg cramps or low back pain.  If you  have puffy, bulging veins (varicose veins) in your legs: ? Wear support hose or compression stockings as told by your doctor. ? Raise (elevate) your feet for 15 minutes, 3-4 times a day. ? Limit salt in your food. Prenatal care  Schedule your prenatal visits by the twelfth week of pregnancy.  Write down your questions. Take them to your prenatal visits.  Keep all your prenatal visits as told by your doctor. This is important. Safety  Wear your seat belt at all times when driving.  Make a list of emergency phone numbers. The list should include numbers for family, friends, the hospital, and police and fire departments. General instructions  Ask your doctor for a referral to a local prenatal class. Begin classes no later than at the start of month 6 of your pregnancy.  Ask for help if you need counseling or if you need help with nutrition. Your doctor can give you advice or tell you where to go for help.  Do not use hot tubs, steam rooms, or saunas.  Do not douche or use tampons or scented sanitary pads.  Do not cross your legs for long periods of time.  Avoid all herbs and alcohol. Avoid drugs that are not approved by your doctor.  Do not use any tobacco products, including cigarettes, chewing tobacco, and electronic cigarettes. If you need help quitting, ask your doctor. You may get counseling or other support to help you quit.  Avoid cat litter boxes and soil used by cats. These carry germs that can cause birth defects in the baby and can cause a loss of your baby (miscarriage) or stillbirth.  Visit your dentist. At home, brush your teeth with a soft toothbrush. Be gentle when you floss. Contact a doctor if:  You are dizzy.  You have mild cramps or pressure in your lower belly.  You have a nagging pain in your belly area.  You continue to feel sick to your stomach, you throw up, or you have watery poop (diarrhea).  You have a bad smelling fluid coming from your  vagina.  You have pain when you pee (urinate).  You have increased puffiness (swelling) in your face, hands, legs, or ankles. Get help right away if:  You have a fever.  You are leaking fluid from your vagina.  You have spotting or bleeding from your vagina.  You have very bad  belly cramping or pain.  You gain or lose weight rapidly.  You throw up blood. It may look like coffee grounds.  You are around people who have [place] measles, fifth disease, or chickenpox.  You have a very bad headache.  You have shortness of breath.  You have any kind of trauma, such as from a fall or a car accident. Summary  The first trimester of pregnancy is from week 1 until the end of week 13 (months 1 through 3).  To take care of yourself and your unborn baby, you will need to eat healthy meals, take medicines only if your doctor tells you to do so, and do activities that are safe for you and your baby.  Keep all follow-up visits as told by your doctor. This is important as your doctor will have to ensure that your baby is healthy and growing well. This information is not intended to replace advice given to you by your health care provider. Make sure you discuss any questions you have with your health care provider. Document Released: 12/12/2007 Document Revised: 07/03/2016 Document Reviewed: 07/03/2016 Elsevier Interactive Patient Education  2017 [employer].

## 2017-01-01 NOTE — Progress Notes (Signed)
DATING AND VIABILITY SONOGRAM   Tabitha Case is a 21 y.o. year old 343P2002 with LMP Patient's last menstrual period was 10/21/2016 (approximate). which would correlate to 9437w2d weeks gestation.  She has regular menstrual cycles.   She is here today for a confirmatory initial sonogram.    GESTATION: 9 weeks 6 days SINGLETON: yes  FETAL ACTIVITY:          Heart rate         160          The fetus is active.   GESTATIONAL AGE AND  BIOMETRICS:  Gestational criteria: Estimated Date of Delivery: 07/28/17 by LMP now at 6937w2d  Previous Scans:0                                                  AVERAGE EGA(BY THIS SCAN):  9 weeks 6 days  WORKING EDD( LMP ):  07/28/2017     TECHNICIAN COMMENTS:  A copy of this report including all images has been saved and backed up to a second source for retrieval if needed. All measures and details of the anatomical scan, placentation, fluid volume and pelvic anatomy are contained in that report.  [place]ass[place], Catha GosselinKristen Stewart 01/01/2017 4:36 PM

## 2017-01-02 LAB — OBSTETRIC PANEL, INCLUDING HIV
Antibody Screen: NEGATIVE
Basophils Absolute: 0 10*3/uL (ref 0.0–0.2)
Basos: 0 %
EOS (ABSOLUTE): 0 10*3/uL (ref 0.0–0.4)
EOS: 1 %
HEMOGLOBIN: 11.5 g/dL (ref 11.1–15.9)
HIV Screen 4th Generation wRfx: NONREACTIVE
Hematocrit: 37.1 % (ref 34.0–46.6)
Hepatitis B Surface Ag: NEGATIVE
IMMATURE GRANS (ABS): 0 10*3/uL (ref 0.0–0.1)
IMMATURE GRANULOCYTES: 0 %
LYMPHS: 32 %
Lymphocytes Absolute: 1.6 10*3/uL (ref 0.7–3.1)
MCH: 23.3 pg — ABNORMAL LOW (ref 26.6–33.0)
MCHC: 31 g/dL — ABNORMAL LOW (ref 31.5–35.7)
MCV: 75 fL — ABNORMAL LOW (ref 79–97)
Monocytes Absolute: 0.6 10*3/uL (ref 0.1–0.9)
Monocytes: 11 %
NEUTROS PCT: 56 %
Neutrophils Absolute: 2.9 10*3/uL (ref 1.4–7.0)
Platelets: 284 10*3/uL (ref 150–379)
RBC: 4.94 x10E6/uL (ref 3.77–5.28)
RDW: 16 % — ABNORMAL HIGH (ref 12.3–15.4)
RH TYPE: POSITIVE
RPR: NONREACTIVE
Rubella Antibodies, IGG: 4.28 index (ref >0.99)
WBC: 5.1 10*3/uL (ref 3.4–10.8)

## 2017-01-02 LAB — HEMOGLOBIN A1C
Est. average glucose Bld gHb Est-mCnc: 105 mg/dL
HEMOGLOBIN A1C: 5.3 % (ref 4.8–5.6)

## 2017-01-03 ENCOUNTER — Encounter: Payer: Self-pay | Admitting: *Deleted

## 2017-01-03 LAB — CERVICOVAGINAL ANCILLARY ONLY
CHLAMYDIA, DNA PROBE: NEGATIVE
NEISSERIA GONORRHEA: NEGATIVE

## 2017-01-03 LAB — URINE CULTURE, OB REFLEX: ORGANISM ID, BACTERIA: NO GROWTH

## 2017-01-03 LAB — CULTURE, OB URINE

## 2017-01-15 ENCOUNTER — Ambulatory Visit (INDEPENDENT_AMBULATORY_CARE_PROVIDER_SITE_OTHER): Payer: Medicaid Other | Admitting: Obstetrics and Gynecology

## 2017-01-15 VITALS — BP 108/72 | HR 97 | Wt 179.0 lb

## 2017-01-15 DIAGNOSIS — Z3481 Encounter for supervision of other normal pregnancy, first trimester: Secondary | ICD-10-CM

## 2017-01-15 DIAGNOSIS — Z3480 Encounter for supervision of other normal pregnancy, unspecified trimester: Secondary | ICD-10-CM

## 2017-01-15 NOTE — Progress Notes (Signed)
Prenatal Visit Note Date: 01/15/2017 Clinic: [facility]  Subjective:  Tabitha Case is a 21 y.o. G3P2002 at 3541w2d being seen today for ongoing prenatal care.  She is currently monitored for the following issues for this low-risk pregnancy and has Supervision of other normal pregnancy, antepartum on her problem list.  Patient reports occasional mid line low back pain Contractions: Not present. Vag. Bleeding: None.  Movement: Absent. Denies leaking of fluid.   The following portions of the patient's history were reviewed and updated as appropriate: allergies, current medications, past family history, past medical history, past social history, past surgical history and problem list. Problem list updated.  Objective:   Vitals:   01/15/17 1124  BP: 108/72  Pulse: 97  Weight: 179 lb (81.2 kg)    Fetal Status: Fetal Heart Rate (bpm): 154   Movement: Absent     General:  Alert, oriented and cooperative. Patient is in no acute distress.  Skin: Skin is warm and dry. No rash noted.   Cardiovascular: Normal heart rate noted  Respiratory: Normal respiratory effort, no problems with respiration noted  Abdomen: Soft, gravid, appropriate for gestational age. Pain/Pressure: Present     Pelvic:  Cervical exam deferred        Extremities: Normal range of motion.  Edema: None  Mental Status: Normal mood and affect. Normal behavior. Normal judgment and thought content.   Urinalysis:    neg for infection  Assessment and Plan:  Pregnancy: G3P2002 at 5841w2d  Routine care. 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   Preterm labor symptoms and general obstetric precautions including but not limited to vaginal bleeding, contractions, leaking of fluid and fetal movement were reviewed in detail with the patient. Please refer to After Visit Summary for other counseling recommendations.  No Follow-up on file.per baby  scripts   [place] BingPickens, Kennie Snedden, MD

## 2017-02-25 ENCOUNTER — Other Ambulatory Visit: Payer: Self-pay | Admitting: Family Medicine

## 2017-02-25 ENCOUNTER — Ambulatory Visit (HOSPITAL_COMMUNITY)
Admission: RE | Admit: 2017-02-25 | Discharge: 2017-02-25 | Disposition: A | Discharge status: Home / Self Care | Payer: Medicaid Other | Source: Ambulatory Visit | Attending: Family Medicine | Admitting: Family Medicine

## 2017-02-25 DIAGNOSIS — Z3A18 18 weeks gestation of pregnancy: Secondary | ICD-10-CM | POA: Diagnosis present

## 2017-02-25 DIAGNOSIS — Z3689 Encounter for other specified antenatal screening: Secondary | ICD-10-CM

## 2017-02-25 DIAGNOSIS — Z3480 Encounter for supervision of other normal pregnancy, unspecified trimester: Secondary | ICD-10-CM | POA: Insufficient documentation

## 2017-03-06 ENCOUNTER — Encounter: Payer: Medicaid Other | Admitting: Student

## 2017-03-07 ENCOUNTER — Ambulatory Visit (INDEPENDENT_AMBULATORY_CARE_PROVIDER_SITE_OTHER): Payer: Medicaid Other | Admitting: Obstetrics and Gynecology

## 2017-03-07 VITALS — BP 101/64 | HR 81 | Wt 181.0 lb

## 2017-03-07 DIAGNOSIS — Z3480 Encounter for supervision of other normal pregnancy, unspecified trimester: Secondary | ICD-10-CM

## 2017-03-07 NOTE — Progress Notes (Signed)
Last BRX BP 02/07/17 - [place].

## 2017-03-07 NOTE — Progress Notes (Signed)
Prenatal Visit Note Date: 03/07/2017 Clinic: [facility]  Subjective:  Tabitha Case is a 21 y.o. G3P2002 at 6345w4d being seen today for ongoing prenatal care.  She is currently monitored for the following issues for this low-risk pregnancy and has Supervision of other normal pregnancy, antepartum on her problem list.  Patient reports no complaints.  [place] in [place] Contractions: Not present. Vag. Bleeding: None.  Movement: Present. Denies leaking of fluid.   The following portions of the patient's history were reviewed and updated as appropriate: allergies, current medications, past family history, past medical history, past social history, past surgical history and problem list. Problem list updated.  Objective:   Vitals:   03/07/17 1024  BP: 101/64  Pulse: 81  Weight: 181 lb (82.1 kg)    Fetal Status: Fetal Heart Rate (bpm): 141   Movement: Present     General:  Alert, oriented and cooperative. Patient is in no acute distress.  Skin: Skin is warm and dry. No rash noted.   Cardiovascular: Normal heart rate noted  Respiratory: Normal respiratory effort, no problems with respiration noted  Abdomen: Soft, gravid, appropriate for gestational age. Pain/Pressure: Absent     Pelvic:  Cervical exam deferred        Extremities: Normal range of motion.  Edema: None  Mental Status: Normal mood and affect. Normal behavior. Normal judgment and thought content.   Urinalysis:      Assessment and Plan:  Pregnancy: G3P2002 at 6645w4d  1. Supervision of other normal pregnancy, antepartum F/u anatomy u/s rpt - US MFM OB FOLLOW UP; Future  Preterm labor symptoms and general obstetric precautions including but not limited to vaginal bleeding, contractions, leaking of fluid and fetal movement were reviewed in detail with the patient. Please refer to After Visit Summary for other counseling recommendations.  Return in about 4 weeks (around 04/04/2017) for  rob.   [place] BingPickens, Pinchas Reither, MD

## 2017-03-16 ENCOUNTER — Inpatient Hospital Stay (HOSPITAL_COMMUNITY)
Admission: AD | Admit: 2017-03-16 | Discharge: 2017-03-16 | Disposition: A | Discharge status: Home / Self Care | Payer: Medicaid Other | Source: Ambulatory Visit | Attending: Obstetrics & Gynecology | Admitting: Obstetrics & Gynecology

## 2017-03-16 ENCOUNTER — Encounter (HOSPITAL_COMMUNITY): Payer: Self-pay

## 2017-03-16 ENCOUNTER — Ambulatory Visit (HOSPITAL_COMMUNITY)
Admission: EM | Admit: 2017-03-16 | Discharge: 2017-03-16 | Disposition: A | Discharge status: Other Acute Inpatient Hospital | Payer: Medicaid Other

## 2017-03-16 DIAGNOSIS — O26892 Other specified pregnancy related conditions, second trimester: Secondary | ICD-10-CM

## 2017-03-16 DIAGNOSIS — O26899 Other specified pregnancy related conditions, unspecified trimester: Secondary | ICD-10-CM

## 2017-03-16 DIAGNOSIS — R102 Pelvic and perineal pain: Secondary | ICD-10-CM | POA: Diagnosis not present

## 2017-03-16 DIAGNOSIS — I471 Supraventricular tachycardia: Secondary | ICD-10-CM | POA: Insufficient documentation

## 2017-03-16 DIAGNOSIS — Z3A20 20 weeks gestation of pregnancy: Secondary | ICD-10-CM | POA: Diagnosis not present

## 2017-03-16 DIAGNOSIS — R109 Unspecified abdominal pain: Secondary | ICD-10-CM | POA: Diagnosis present

## 2017-03-16 DIAGNOSIS — O99412 Diseases of the circulatory system complicating pregnancy, second trimester: Secondary | ICD-10-CM | POA: Insufficient documentation

## 2017-03-16 LAB — CBC
HCT: 34.6 % — ABNORMAL LOW (ref 36.0–46.0)
Hemoglobin: 11.4 g/dL — ABNORMAL LOW (ref 12.0–15.0)
MCH: 24.2 pg — ABNORMAL LOW (ref 26.0–34.0)
MCHC: 32.9 g/dL (ref 30.0–36.0)
MCV: 73.5 fL — AB (ref 78.0–100.0)
Platelets: 224 10*3/uL (ref 150–400)
RBC: 4.71 MIL/uL (ref 3.87–5.11)
RDW: 15.5 % (ref 11.5–15.5)
WBC: 7.2 10*3/uL (ref 4.0–10.5)

## 2017-03-16 LAB — URINALYSIS, ROUTINE W REFLEX MICROSCOPIC
Bilirubin Urine: NEGATIVE
Glucose, UA: NEGATIVE mg/dL
Hgb urine dipstick: NEGATIVE
KETONES UR: NEGATIVE mg/dL
Nitrite: NEGATIVE
PROTEIN: NEGATIVE mg/dL
Specific Gravity, Urine: 1.008 (ref 1.005–1.030)
pH: 6 (ref 5.0–8.0)

## 2017-03-16 LAB — COMPREHENSIVE METABOLIC PANEL
ALT: 8 U/L — AB (ref 14–54)
AST: 23 U/L (ref 15–41)
Albumin: 3.3 g/dL — ABNORMAL LOW (ref 3.5–5.0)
Alkaline Phosphatase: 56 U/L (ref 38–126)
Anion gap: 8 (ref 5–15)
BUN: 7 mg/dL (ref 6–20)
CHLORIDE: 108 mmol/L (ref 101–111)
CO2: 19 mmol/L — ABNORMAL LOW (ref 22–32)
CREATININE: 0.5 mg/dL (ref 0.44–1.00)
Calcium: 8.7 mg/dL — ABNORMAL LOW (ref 8.9–10.3)
GFR calc non Af Amer: >60 mL/min (ref >60)
Glucose, Bld: 76 mg/dL (ref 65–99)
POTASSIUM: 4.3 mmol/L (ref 3.5–5.1)
Sodium: 135 mmol/L (ref 135–145)
Total Bilirubin: 0.5 mg/dL (ref 0.3–1.2)
Total Protein: 6.7 g/dL (ref 6.5–8.1)

## 2017-03-16 LAB — WET PREP, GENITAL
CLUE CELLS WET PREP: NONE SEEN
Sperm: NONE SEEN
TRICH WET PREP: NONE SEEN
YEAST WET PREP: NONE SEEN

## 2017-03-16 LAB — MAGNESIUM: Magnesium: 2 mg/dL (ref 1.7–2.4)

## 2017-03-16 MED ORDER — LACTATED RINGERS IV BOLUS (SEPSIS)
1000.0000 mL | Freq: Once | INTRAVENOUS | Status: AC
  Administered 2017-03-16: 1000 mL via INTRAVENOUS

## 2017-03-16 MED ORDER — METOPROLOL TARTRATE 5 MG/5ML IV SOLN
5.0000 mg | Freq: Once | INTRAVENOUS | Status: AC
  Administered 2017-03-16: 5 mg via INTRAVENOUS
  Filled 2017-03-16: qty 5

## 2017-03-16 NOTE — MAU Provider Note (Signed)
History     CSN: [id number]  Arrival date and time: 03/16/17 1545   First Provider Initiated Contact with Patient 03/16/17 1612      Chief Complaint  Patient presents with  . Abdominal Pain   G3P2002 .6 weeks here with LAP and heart racing. LAP started yesterday. Describes as bilateral, intermittent, and "sore". Pain worsens with standing and improves when she lies down. Denies VB, LOF, and vaginal discharge. No urinary sx. No fever. She ate lunch around 11 am then began to feel SOB and her heart racing. She drank some water and took a nap then felt the fast HR again after she woke. No previous episodes of these sx or cardiac hx. Reports daily low back pain, nausea, and vomiting but these are ongoing since early pregnancy.    OB History    Gravida Para Term Preterm AB Living   SAB TAB Ectopic Multiple Live Births           2      Past Medical History:  Diagnosis Date  . Ringworm     Past Surgical History:  Procedure Laterality Date  . WISDOM TOOTH EXTRACTION      Family History  Problem Relation Age of Onset  . Family history unknown: Yes    Social History  Substance Use Topics  . Smoking status: Never Smoker  . Smokeless tobacco: Never Used  . Alcohol use No    Allergies:  No Known Allergies  Prescriptions Prior to Admission  Medication Sig Dispense Refill Last Dose  . acetaminophen (TYLENOL) 500 MG tablet Take 1,000 mg by mouth every 6 (six) hours as needed for mild pain or headache.   03/16/2017 at Unknown time  . Prenatal Vit-Fe Fumarate-FA (PRENATAL MULTIVITAMIN) TABS tablet Take 1 tablet by mouth daily at 12 noon.   03/16/2017 at Unknown time  . Doxylamine-Pyridoxine ER (BONJESTA) 20-20 MG TBCR Take 1 tablet by mouth 2 (two) times daily. (Patient not taking: Reported on 03/16/2017) 60 tablet 3 Not Taking at Unknown time    Review of Systems  Constitutional: Negative for fatigue.  Respiratory: Positive for shortness of breath.    Cardiovascular: Positive for palpitations. Negative for chest pain.  Gastrointestinal: Positive for abdominal pain, nausea and vomiting. Negative for constipation and diarrhea.  Genitourinary: Negative for vaginal bleeding and vaginal discharge.  Musculoskeletal: Positive for back pain.   Physical Exam   Blood pressure 96/61, pulse (!) 176, temperature 98.4 F (36.9 C), temperature source Oral, resp. rate 18, height  (1.753 m), weight 181 lb (82.1 kg), last menstrual period 10/21/2016, SpO2 100 %, currently breastfeeding.  Physical Exam  Constitutional: She is oriented to person, place, and time. She appears well-developed and well-nourished. No distress.  HENT:  Head: Normocephalic and atraumatic.  Neck: Normal range of motion.  Cardiovascular: Regular rhythm and normal heart sounds.   tachy  Respiratory: Effort normal. No respiratory distress. She has no wheezes. She has no rales.  GI: Soft. She exhibits no distension and no mass. There is no tenderness. There is no rebound and no guarding.  Musculoskeletal: Normal range of motion.  Neurological: She is alert and oriented to person, place, and time.  Skin: Skin is warm and dry.  Psychiatric: She has a normal mood and affect.  FHT 154  Results for orders placed or performed during the hospital encounter of 03/16/17 (from the past 24 hour(s))  Urinalysis, Routine w reflex microscopic  Status: Abnormal   Collection Time: 03/16/17  3:50 PM  Result Value Ref Range   Color, Urine YELLOW YELLOW   APPearance CLEAR CLEAR   Specific Gravity, Urine 1.008 1.005 - 1.030   pH 6.0 5.0 - 8.0   Glucose, UA NEGATIVE NEGATIVE mg/dL   Hgb urine dipstick NEGATIVE NEGATIVE   Bilirubin Urine NEGATIVE NEGATIVE   Ketones, ur NEGATIVE NEGATIVE mg/dL   Protein, ur NEGATIVE NEGATIVE mg/dL   Nitrite NEGATIVE NEGATIVE   Leukocytes, UA TRACE (A) NEGATIVE   RBC / HPF 0-5 0 - 5 RBC/hpf   WBC, UA 0-5 0 - 5 WBC/hpf   Bacteria, UA FEW (A) NONE SEEN    Squamous Epithelial / LPF 0-5 (A) NONE SEEN   Mucus PRESENT   CBC     Status: Abnormal   Collection Time: 03/16/17  4:20 PM  Result Value Ref Range   WBC 7.2 4.0 - 10.5 K/uL   RBC 4.71 3.87 - 5.11 MIL/uL   Hemoglobin 11.4 (L) 12.0 - 15.0 g/dL   HCT 40.934.6 (L) 81.136.0 - 91.446.0 %   MCV 73.5 (L) 78.0 - 100.0 fL   MCH 24.2 (L) 26.0 - 34.0 pg   MCHC 32.9 30.0 - 36.0 g/dL   RDW 78.215.5 95.611.5 - 21.315.5 %   Platelets 224 150 - 400 K/uL  Comprehensive metabolic panel     Status: Abnormal   Collection Time: 03/16/17  4:20 PM  Result Value Ref Range   Sodium 135 135 - 145 mmol/L   Potassium 4.3 3.5 - 5.1 mmol/L   Chloride 108 101 - 111 mmol/L   CO2 19 (L) 22 - 32 mmol/L   Glucose, Bld 76 65 - 99 mg/dL   BUN 7 6 - 20 mg/dL   Creatinine, Ser 0.860.50 0.44 - 1.00 mg/dL   Calcium 8.7 (L) 8.9 - 10.3 mg/dL   Total Protein 6.7 6.5 - 8.1 g/dL   Albumin 3.3 (L) 3.5 - 5.0 g/dL   AST 23 15 - 41 U/L   ALT 8 (L) 14 - 54 U/L   Alkaline Phosphatase 56 38 - 126 U/L   Total Bilirubin 0.5 0.3 - 1.2 mg/dL   GFR calc non Af Amer >60 >60 mL/min   GFR calc Af Amer >60 >60 mL/min   Anion gap 8 5 - 15   MAU Course  Procedures LR 1 L Metoprolol 5 mg IV  MDM EKG and labs ordered. Consult with Dr. Purvis SheffieldKoneswaran (cardiology) >EKG shows "slow SVT". Recommend replete K if under 4 and Mg if under 2, and give Metoprolol 5 mg IV once. Follow up with cardiology if sx recur. Good response in HR, no 90s. No evidence of acute abdominal or pelvic process. Pain likely RL. Stable for discharge home.   Assessment and Plan   1. [redacted] weeks gestation of pregnancy   2. Supraventricular tachycardia (HCC)   3. Pain of round ligament during pregnancy    Discharge home Follow up in OB office as scheduled Return if tachycardia recurs- recommend [place]  Allergies as of 03/16/2017   No Known Allergies     Medication List    STOP taking these medications   Doxylamine-Pyridoxine ER 20-20 MG Tbcr Commonly known as:  BONJESTA     TAKE  these medications   acetaminophen 500 MG tablet Commonly known as:  TYLENOL Take 1,000 mg by mouth every 6 (six) hours as needed for mild pain or headache.   prenatal multivitamin Tabs tablet Take 1 tablet by  mouth daily at 12 noon.            Discharge Care Instructions        Start     Ordered   03/16/17 0000  Discharge patient    Question Answer Comment  Discharge disposition 01-Home or Self Care   Discharge patient date 03/16/2017      03/16/17 1802     Donette Larry, CNM 03/16/2017, 5:13 PM

## 2017-03-16 NOTE — MAU Note (Addendum)
G3P2 @ 20.[redacted] wksga. Presents to triage for abdominal pain since yesterday morning. Took tylenol x2 and didn't help. Took another x2 tabs at 0800 and still didn't help. Denies bleeding + FM but has not felt flutter for couple days. HR increased around 1100. Pulse ox placed. HR 149 currently.   Provider at bs assessing pt. Orders for labs and IV LR bolus.   1625: Labs and IV started.  EKG ordered and being done by RN tech.   EKG result given to provider.   Magnesium level ordered. Lab notified of the order.  1725: Lopressor administered. HR 117  1812: HR 99. Discharge instructions given with pt understanding. Pt left unit via ambulatory.

## 2017-03-16 NOTE — Discharge Instructions (Signed)
Round Ligament Pain The round ligament is a cord of muscle and tissue that helps to support the uterus. It can become a source of pain during pregnancy if it becomes stretched or twisted as the baby grows. The pain usually begins in the second trimester of pregnancy, and it can come and go until the baby is delivered. It is not a serious problem, and it does not cause harm to the baby. Round ligament pain is usually a short, sharp, and pinching pain, but it can also be a dull, lingering, and aching pain. The pain is felt in the lower side of the abdomen or in the groin. It usually starts deep in the groin and moves up to the outside of the hip area. Pain can occur with:  A sudden change in position.  Rolling over in bed.  Coughing or sneezing.  Physical activity.  Follow these instructions at home: Watch your condition for any changes. Take these steps to help with your pain:  When the pain starts, relax. Then try: ? Sitting down. ? Flexing your knees up to your abdomen. ? Lying on your side with one pillow under your abdomen and another pillow between your legs. ? Sitting in a warm bath for 15-20 minutes or until the pain goes away.  Take over-the-counter and prescription medicines only as told by your health care provider.  Move slowly when you sit and stand.  Avoid long walks if they cause pain.  Stop or lessen your physical activities if they cause pain.  Contact a health care provider if:  Your pain does not go away with treatment.  You feel pain in your back that you did not have before.  Your medicine is not helping. Get help right away if:  You develop a fever or chills.  You develop uterine contractions.  You develop vaginal bleeding.  You develop nausea or vomiting.  You develop diarrhea.  You have pain when you urinate. This information is not intended to replace advice given to you by your health care provider. Make sure you discuss any questions you have  with your health care provider. Document Released: 04/03/2008 Document Revised: 12/01/2015 Document Reviewed: 09/01/2014 Elsevier Interactive Patient Education  2018 [employer] Supraventricular Tachycardia, Adult Supraventricular tachycardia (SVT) is a kind of abnormal heartbeat. It makes your heart beat very fast and then beat at a normal speed. A normal heart beats 60-100 times a minute. This condition can make your heart beat more than 150 times a minute. Times of having a fast heartbeat (episodes) can be scary, but they are usually not dangerous. They can lead to problems if:  They happen often.  They last a long time.  Symptoms of this condition include:  A pounding heart.  A feeling that your heart is skipping beats (palpitations).  Weakness.  Trouble getting enough air (shortness of breath).  Pain or tightness in your chest.  Feeling like you are going to pass out (light-headedness).  Feeling worried or nervous (anxiety).  Dizziness.  Sweating.  Feeling sick to your stomach (nausea).  Passing out (fainting).  Tiredness.  Sometimes, there are no symptoms. Follow these instructions at home: Stress  Avoid things that make you feel stressed.  Find out what helps you feel less stressed. Try: ? Doing a relaxing activity, like yoga, meditation, or being out in nature. ? Listening to relaxing music. ? Doing relaxation techniques, like deep breathing. ? Taking steps to be healthy. These include getting lots of sleep, exercising,  and eating a balanced diet. ? Talking with a mental health doctor. Sleep  Try to get at least 7 hours of sleep each night. Tobacco and nicotine  Do not use anything that has nicotine or tobacco, such as cigarettes and e-cigarettes. If you need help quitting, ask your doctor. Alcohol  If alcohol gives you a fast heartbeat, do not drink alcohol.  If alcohol does not seem to give you a fast heartbeat, limit your alcohol. For  nonpregnant women, this means no more than 1 drink a day. For men, this means no more than 2 drinks a day. "One drink" means one of these: ? 12 oz of beer. ? 5 oz of wine. ? 1 oz of hard liquor. Caffeine  If caffeine gives you a fast heartbeat, do not eat, drink, or use anything with caffeine in it.  If caffeine does not seem to give you a fast heartbeat, limit how much caffeine you eat, drink, or use. Stimulant drugs  Do not use stimulant drugs. These are drugs like cocaine or methamphetamine. If you need help quitting, ask your doctor. General instructions  Stay at a healthy weight.  Exercise regularly. Ask your doctor to suggest some good activities for you. Try one of these options: ? 150 minutes a week of gentle exercise, like walking or yoga. ? 75 minutes a week of exercise that is very active, like running or swimming. ? A combination of gentle exercise and very active exercise.  Do home treatments to slow down your heartbeat as told by your doctor.  Take over-the-counter and prescription medicines only as told by your doctor. Contact a doctor if:  You have a fast heartbeat more often.  Times of having a fast heartbeat last longer than before.  Your home treatments to slow down your heartbeat do not help.  You have new symptoms. Get help right away if:  You have chest pain.  Your symptoms get worse.  You have trouble breathing.  Your heart beats very fast for more than 20 minutes.  You pass out (faint). These symptoms may be an emergency. Do not wait to see if the symptoms will go away. Get medical help right away. Call your local emergency services (911 in the [place] Do not drive yourself to the hospital. This information is not intended to replace advice given to you by your health care provider. Make sure you discuss any questions you have with your health care provider. Document Released: 06/25/2005 Document Revised: 03/01/2016 Document Reviewed:  03/01/2016 Elsevier Interactive Patient Education  2017 [employer].

## 2017-03-18 LAB — GC/CHLAMYDIA PROBE AMP (~~LOC~~) NOT AT ARMC
CHLAMYDIA, DNA PROBE: NEGATIVE
Neisseria Gonorrhea: NEGATIVE

## 2017-03-28 ENCOUNTER — Encounter (HOSPITAL_COMMUNITY): Payer: Self-pay | Admitting: *Deleted

## 2017-03-28 ENCOUNTER — Ambulatory Visit (HOSPITAL_COMMUNITY)
Admission: EM | Admit: 2017-03-28 | Discharge: 2017-03-28 | Disposition: A | Discharge status: Home / Self Care | Payer: Medicaid Other | Attending: Family Medicine | Admitting: Family Medicine

## 2017-03-28 DIAGNOSIS — H578 Other specified disorders of eye and adnexa: Secondary | ICD-10-CM | POA: Diagnosis not present

## 2017-03-28 DIAGNOSIS — H1032 Unspecified acute conjunctivitis, left eye: Secondary | ICD-10-CM | POA: Diagnosis not present

## 2017-03-28 DIAGNOSIS — H5789 Other specified disorders of eye and adnexa: Secondary | ICD-10-CM

## 2017-03-28 MED ORDER — POLYMYXIN B-TRIMETHOPRIM 10000-0.1 UNIT/ML-% OP SOLN: 1.0000 [drp] | OPHTHALMIC | 0 refills | Status: DC

## 2017-03-28 NOTE — ED Provider Notes (Signed)
[facility]    CSN: [id number] Arrival date & time: 03/28/17  1333     History   Chief Complaint Chief Complaint  Patient presents with  . Eye Problem    HPI Tabitha Case is a 21 y.o. female.   21 year old female presents to the urgent care today after developing some irritation to the left eye early this morning. He has began to turn red and upon awakening this morning she had some matting to her eyelashes. She describes no actual drainage. Vision is slightly blurry when there is matting to the eyelashes. Otherwise no pain. No sensation of foreign body.      Past Medical History:  Diagnosis Date  . Ringworm     Patient Active Problem List   Diagnosis Date Noted  . Supraventricular tachycardia (HCC) 03/16/2017  . Supervision of other normal pregnancy, antepartum 01/01/2017    Past Surgical History:  Procedure Laterality Date  . WISDOM TOOTH EXTRACTION      OB History    Gravida Para Term Preterm AB Living   SAB TAB Ectopic Multiple Live Births           2       Home Medications    Prior to Admission medications   Medication Sig Start Date End Date Taking? Authorizing Provider  acetaminophen (TYLENOL) 500 MG tablet Take 1,000 mg by mouth every 6 (six) hours as needed for mild pain or headache.    [provider]  Prenatal Vit-Fe Fumarate-FA (PRENATAL MULTIVITAMIN) TABS tablet Take 1 tablet by mouth daily at 12 noon.    [provider]  trimethoprim-polymyxin b (POLYTRIM) ophthalmic solution Place 1 drop into the left eye every 4 (four) hours. For 5 days 03/28/17   Hayden Rasmussen, NP    Family History Family History  Problem Relation Age of Onset  . Family history unknown: Yes    Social History Social History  Substance Use Topics  . Smoking status: Never Smoker  . Smokeless tobacco: Never Used  . Alcohol use No     Allergies   Patient has no known allergies.   Review of Systems Review of Systems    Constitutional: Negative.   HENT: Negative.   Eyes: Positive for discharge, redness and itching. Negative for photophobia and visual disturbance.  Respiratory: Negative.   Gastrointestinal: Negative.   Neurological: Negative.   All other systems reviewed and are negative.    Physical Exam Triage Vital Signs ED Triage Vitals [03/28/17 1413]  Enc Vitals Group     BP (!) 107/54     Pulse Rate 78     Resp 18     Temp 97.6 F (36.4 C)     Temp Source Oral     SpO2 99 %     Weight      Height      Head Circumference      Peak Flow      Pain Score      Pain Loc      Pain Edu?      Excl. in GC?    No data found.   Updated Vital Signs BP (!) 107/54 (BP Location: Left Arm)   Pulse 78   Temp 97.6 F (36.4 C) (Oral)   Resp 18   LMP 10/21/2016 (Approximate)   SpO2 99%   Visual Acuity Right Eye Distance:   Left Eye Distance:   Bilateral Distance:  Right Eye Near:   Left Eye Near:    Bilateral Near:     Physical Exam  Constitutional: She is oriented to person, place, and time. She appears well-developed and well-nourished. No distress.  Eyes: Pupils are equal, round, and reactive to light. EOM are normal.  Left conjunctiva with mild erythema. Minimal swelling under the left eye. The sclera with minimal erythema. No dilated vessels. The anterior chambers clear. Red reflex normal. Disc is sharp. No foreign bodies are seen. Currently no drainage.  Neck: Neck supple.  Cardiovascular: Normal rate.   Pulmonary/Chest: Effort normal.  Neurological: She is alert and oriented to person, place, and time.  Skin: Skin is warm and dry.  Nursing note and vitals reviewed.    UC Treatments / Results  Labs (all labs ordered are listed, but only abnormal results are displayed) Labs Reviewed - No data to display  EKG  EKG Interpretation None       Radiology No results found.  Procedures Procedures (including critical care time)  Medications Ordered in  UC Medications - No data to display   Initial Impression / Assessment and Plan / UC Course  I have reviewed the triage vital signs and the nursing notes.  Pertinent labs & imaging results that were available during my care of the patient were reviewed by me and considered in my medical decision making (see chart for details).     Use the antibiotic eyedrops as directed. Also obtain Zaditor eyedrops that she can use 1 drop in the left eye twice a day to help with inflammation and redness and discomfort. For worsening or new symptoms may want to follow-up with the ophthalmologist listed on this page.   Final Clinical Impressions(s) / UC Diagnoses   Final diagnoses:  Acute conjunctivitis of left eye, unspecified acute conjunctivitis type  Eye irritation    New Prescriptions New Prescriptions   TRIMETHOPRIM-POLYMYXIN B (POLYTRIM) OPHTHALMIC SOLUTION    Place 1 drop into the left eye every 4 (four) hours. For 5 days     Controlled Substance Prescriptions [place] Controlled Substance Registry consulted? Not Applicable   Hayden Rasmussen, NP 03/28/17 1444

## 2017-03-28 NOTE — ED Triage Notes (Signed)
Woke  Up  This   Am  With  Eye  Irritation this   Am    The  Left    Feels      Feels  Gritty

## 2017-03-28 NOTE — Discharge Instructions (Signed)
Use the antibiotic eyedrops as directed. Also obtain Zaditor eyedrops that she can use 1 drop in the left eye twice a day to help with inflammation and redness and discomfort. For worsening or new symptoms may want to follow-up with the ophthalmologist listed on this page.

## 2017-04-05 ENCOUNTER — Ambulatory Visit (HOSPITAL_COMMUNITY)
Admission: RE | Admit: 2017-04-05 | Discharge: 2017-04-05 | Disposition: A | Discharge status: Home / Self Care | Payer: Medicaid Other | Source: Ambulatory Visit | Attending: Obstetrics and Gynecology | Admitting: Obstetrics and Gynecology

## 2017-04-05 ENCOUNTER — Other Ambulatory Visit: Payer: Self-pay | Admitting: Obstetrics and Gynecology

## 2017-04-05 DIAGNOSIS — Z3A23 23 weeks gestation of pregnancy: Secondary | ICD-10-CM | POA: Insufficient documentation

## 2017-04-05 DIAGNOSIS — O321XX0 Maternal care for breech presentation, not applicable or unspecified: Secondary | ICD-10-CM | POA: Insufficient documentation

## 2017-04-05 DIAGNOSIS — Z0489 Encounter for examination and observation for other specified reasons: Secondary | ICD-10-CM

## 2017-04-05 DIAGNOSIS — Z362 Encounter for other antenatal screening follow-up: Secondary | ICD-10-CM | POA: Diagnosis present

## 2017-04-05 DIAGNOSIS — Z3480 Encounter for supervision of other normal pregnancy, unspecified trimester: Secondary | ICD-10-CM

## 2017-04-05 DIAGNOSIS — IMO0002 Reserved for concepts with insufficient information to code with codable children: Secondary | ICD-10-CM

## 2017-04-08 ENCOUNTER — Telehealth: Payer: Self-pay | Admitting: *Deleted

## 2017-04-08 DIAGNOSIS — B354 Tinea corporis: Secondary | ICD-10-CM

## 2017-04-08 DIAGNOSIS — Z3480 Encounter for supervision of other normal pregnancy, unspecified trimester: Secondary | ICD-10-CM

## 2017-04-08 NOTE — Telephone Encounter (Signed)
Ordered f/U anatomy scan

## 2017-05-02 ENCOUNTER — Ambulatory Visit (INDEPENDENT_AMBULATORY_CARE_PROVIDER_SITE_OTHER): Payer: Medicaid Other | Admitting: Obstetrics and Gynecology

## 2017-05-02 VITALS — BP 120/72 | HR 103 | Wt 190.4 lb

## 2017-05-02 DIAGNOSIS — I471 Supraventricular tachycardia: Secondary | ICD-10-CM

## 2017-05-02 DIAGNOSIS — Z3480 Encounter for supervision of other normal pregnancy, unspecified trimester: Secondary | ICD-10-CM

## 2017-05-02 NOTE — Progress Notes (Signed)
Prenatal Visit Note Date: 05/02/2017 Clinic: [facility]  Subjective:  Tabitha Case is a 21 y.o. G3P2002 at [redacted]w[redacted]d being seen today for ongoing prenatal care.  She is currently monitored for the following issues for this low-risk pregnancy and has Supervision of other normal pregnancy, antepartum and Supraventricular tachycardia (HCC) on her problem list.  Patient reports no complaints.   Contractions: Not present. Vag. Bleeding: None.  Movement: Present. Denies leaking of fluid.   The following portions of the patient's history were reviewed and updated as appropriate: allergies, current medications, past family history, past medical history, past social history, past surgical history and problem list. Problem list updated.  Objective:   Vitals:   05/02/17 0836  BP: 120/72  Pulse: (!) 103  Weight: 190 lb 6.4 oz (86.4 kg)    Fetal Status: Fetal Heart Rate (bpm): 142 Fundal Height: 28 cm Movement: Present     General:  Alert, oriented and cooperative. Patient is in no acute distress.  Skin: Skin is warm and dry. No rash noted.   Cardiovascular: Normal heart rate noted  Respiratory: Normal respiratory effort, no problems with respiration noted  Abdomen: Soft, gravid, appropriate for gestational age. Pain/Pressure: Absent     Pelvic:  Cervical exam deferred        Extremities: Normal range of motion.  Edema: None  Mental Status: Normal mood and affect. Normal behavior. Normal judgment and thought content.   Urinalysis:      Assessment and Plan:  Pregnancy: G3P2002 at [redacted]w[redacted]d  1. Supervision of other normal pregnancy, antepartum Routine care. F/u completion anatomy scan early nov.d/w pt re: BC nv.  - Glucose Tolerance, 2 Hours w/1 Hour - CBC - HIV antibody - RPR - Tdap vaccine greater than or equal to 22yo IM  2. Supraventricular tachycardia (HCC) No further s/s since MAU visit. Refer to cards if s/s recur  Preterm labor symptoms and general obstetric  precautions including but not limited to vaginal bleeding, contractions, leaking of fluid and fetal movement were reviewed in detail with the patient. Please refer to After Visit Summary for other counseling recommendations.  Return in about 3 weeks (around 05/23/2017) for rob.   [place] BingPickens, Tabitha Palma, MD

## 2017-05-03 ENCOUNTER — Other Ambulatory Visit: Payer: Self-pay | Admitting: Obstetrics and Gynecology

## 2017-05-03 LAB — RPR: RPR Ser Ql: NONREACTIVE

## 2017-05-03 LAB — CBC
HEMATOCRIT: 31.3 % — AB (ref 34.0–46.6)
Hemoglobin: 9.6 g/dL — ABNORMAL LOW (ref 11.1–15.9)
MCH: 23.8 pg — AB (ref 26.6–33.0)
MCHC: 30.7 g/dL — AB (ref 31.5–35.7)
MCV: 78 fL — AB (ref 79–97)
Platelets: 236 10*3/uL (ref 150–379)
RBC: 4.03 x10E6/uL (ref 3.77–5.28)
RDW: 15.7 % — ABNORMAL HIGH (ref 12.3–15.4)
WBC: 8 10*3/uL (ref 3.4–10.8)

## 2017-05-03 LAB — GLUCOSE TOLERANCE, 2 HOURS W/ 1HR
Glucose, 1 hour: 117 mg/dL (ref 65–179)
Glucose, 2 hour: 53 mg/dL — ABNORMAL LOW (ref 65–152)
Glucose, Fasting: 73 mg/dL (ref 65–91)

## 2017-05-03 LAB — HIV ANTIBODY (ROUTINE TESTING W REFLEX): HIV Screen 4th Generation wRfx: NONREACTIVE

## 2017-05-03 MED ORDER — DOCUSATE SODIUM 100 MG PO CAPS: 100.0000 mg | ORAL_CAPSULE | Freq: Every day | ORAL | 3 refills | Status: DC

## 2017-05-03 MED ORDER — FERROUS GLUCONATE 324 (38 FE) MG PO TABS: 324.0000 mg | ORAL_TABLET | Freq: Two times a day (BID) | ORAL | 3 refills | Status: DC

## 2017-05-10 ENCOUNTER — Ambulatory Visit (HOSPITAL_COMMUNITY)
Admission: RE | Admit: 2017-05-10 | Discharge: 2017-05-10 | Disposition: A | Discharge status: Home / Self Care | Payer: Medicaid Other | Source: Ambulatory Visit | Attending: Obstetrics and Gynecology | Admitting: Obstetrics and Gynecology

## 2017-05-10 DIAGNOSIS — Z3480 Encounter for supervision of other normal pregnancy, unspecified trimester: Secondary | ICD-10-CM

## 2017-05-10 DIAGNOSIS — Z362 Encounter for other antenatal screening follow-up: Secondary | ICD-10-CM | POA: Diagnosis present

## 2017-06-05 ENCOUNTER — Ambulatory Visit (INDEPENDENT_AMBULATORY_CARE_PROVIDER_SITE_OTHER): Payer: Medicaid Other | Admitting: Family Medicine

## 2017-06-05 VITALS — BP 104/69 | HR 101 | Wt 197.2 lb

## 2017-06-05 DIAGNOSIS — Z3480 Encounter for supervision of other normal pregnancy, unspecified trimester: Secondary | ICD-10-CM

## 2017-06-05 DIAGNOSIS — Z23 Encounter for immunization: Secondary | ICD-10-CM | POA: Diagnosis not present

## 2017-06-05 DIAGNOSIS — O99019 Anemia complicating pregnancy, unspecified trimester: Secondary | ICD-10-CM

## 2017-06-05 DIAGNOSIS — I471 Supraventricular tachycardia: Secondary | ICD-10-CM

## 2017-06-05 DIAGNOSIS — Z3483 Encounter for supervision of other normal pregnancy, third trimester: Secondary | ICD-10-CM

## 2017-06-05 MED ORDER — FERROUS GLUCONATE 324 (38 FE) MG PO TABS: 324.0000 mg | ORAL_TABLET | Freq: Two times a day (BID) | ORAL | 3 refills | Status: AC

## 2017-06-05 NOTE — Progress Notes (Signed)
   PRENATAL VISIT NOTE  Subjective:  Tabitha Case is a 21 y.o. G3P2002 at 7267w3d being seen today for ongoing prenatal care.  She is currently monitored for the following issues for this low-risk pregnancy and has Supervision of other normal pregnancy, antepartum and Supraventricular tachycardia (HCC) on their problem list.  Patient reports no complaints.  Contractions: Not present.  .  Movement: Present. Denies leaking of fluid.   The following portions of the patient's history were reviewed and updated as appropriate: allergies, current medications, past family history, past medical history, past social history, past surgical history and problem list. Problem list updated.  Objective:   Vitals:   06/05/17 0847  BP: 104/69  Pulse: (!) 101  Weight: 197 lb 3.2 oz (89.4 kg)    Fetal Status: Fetal Heart Rate (bpm): 131   Movement: Present     General:  Alert, oriented and cooperative. Patient is in no acute distress.  Skin: Skin is warm and dry. No rash noted.   Cardiovascular: Normal heart rate noted  Respiratory: Normal respiratory effort, no problems with respiration noted  Abdomen: Soft, gravid, appropriate for gestational age.  Pain/Pressure: Absent     Pelvic: Cervical exam deferred        Extremities: Normal range of motion.  Edema: None  Mental Status:  Normal mood and affect. Normal behavior. Normal judgment and thought content.   Assessment and Plan:  Pregnancy: G3P2002 at 5167w3d  1. Supraventricular tachycardia (HCC) Reports one episode since last visit, did not seek treatment/care. Discussed going to MAU if this happens again and likely referral to cardiology. Pt voiced understandning  2. Supervision of other normal pregnancy, antepartum UTD currently Weight gain is appropriate  Discussed FH with patient. If continued lag plan for US EFW  3. Anemia- not takign FE. Discussed importance and taking with citric juice to help absorption. Discussed constipation SE and use  of colace.  Reviewed plan to recheck in 3-4 weeks of treatment.   Preterm labor symptoms and general obstetric precautions including but not limited to vaginal bleeding, contractions, leaking of fluid and fetal movement were reviewed in detail with the patient. Please refer to After Visit Summary for other counseling recommendations.  Return in about 2 weeks (around 06/19/2017).   Federico FlakeKimberly Niles Malikye Reppond, MD

## 2017-06-19 ENCOUNTER — Encounter: Payer: Self-pay | Admitting: Family Medicine

## 2017-06-19 ENCOUNTER — Ambulatory Visit (INDEPENDENT_AMBULATORY_CARE_PROVIDER_SITE_OTHER): Payer: Medicaid Other | Admitting: Family Medicine

## 2017-06-19 VITALS — BP 103/68 | HR 96 | Wt 200.3 lb

## 2017-06-19 DIAGNOSIS — I471 Supraventricular tachycardia: Secondary | ICD-10-CM

## 2017-06-19 DIAGNOSIS — D649 Anemia, unspecified: Secondary | ICD-10-CM

## 2017-06-19 DIAGNOSIS — Z3483 Encounter for supervision of other normal pregnancy, third trimester: Secondary | ICD-10-CM

## 2017-06-19 DIAGNOSIS — O99019 Anemia complicating pregnancy, unspecified trimester: Secondary | ICD-10-CM

## 2017-06-19 DIAGNOSIS — O99013 Anemia complicating pregnancy, third trimester: Secondary | ICD-10-CM

## 2017-06-19 DIAGNOSIS — O99891 Other specified diseases and conditions complicating pregnancy: Secondary | ICD-10-CM

## 2017-06-19 DIAGNOSIS — M549 Dorsalgia, unspecified: Secondary | ICD-10-CM

## 2017-06-19 DIAGNOSIS — O9989 Other specified diseases and conditions complicating pregnancy, childbirth and the puerperium: Secondary | ICD-10-CM

## 2017-06-19 DIAGNOSIS — Z3480 Encounter for supervision of other normal pregnancy, unspecified trimester: Secondary | ICD-10-CM

## 2017-06-19 NOTE — Progress Notes (Signed)
Back pain

## 2017-06-19 NOTE — Progress Notes (Signed)
   PRENATAL VISIT NOTE  Subjective:  Tabitha Case is a 21 y.o. G3P2002 at 2948w3d being seen today for ongoing prenatal care.  She is currently monitored for the following issues for this low-risk pregnancy and has Supervision of other normal pregnancy, antepartum; Supraventricular tachycardia (HCC); and Anemia of mother in pregnancy, antepartum on their problem list.  Patient reports backache.  Contractions: Irregular.  .  Movement: Present. Denies leaking of fluid.   The following portions of the patient's history were reviewed and updated as appropriate: allergies, current medications, past family history, past medical history, past social history, past surgical history and problem list. Problem list updated.  Objective:   Vitals:   06/19/17 0840  BP: 103/68  Pulse: 96  Weight: 200 lb 4.8 oz (90.9 kg)    Fetal Status: Fetal Heart Rate (bpm): 145   Movement: Present     General:  Alert, oriented and cooperative. Patient is in no acute distress.  Skin: Skin is warm and dry. No rash noted.   Cardiovascular: Normal heart rate noted  Respiratory: Normal respiratory effort, no problems with respiration noted  Abdomen: Soft, gravid, appropriate for gestational age.  Pain/Pressure: Present     Pelvic: Cervical exam deferred        Extremities: Normal range of motion.  Edema: None  Mental Status:  Normal mood and affect. Normal behavior. Normal judgment and thought content.   Assessment and Plan:  Pregnancy: G3P2002 at 6348w3d  1. Supraventricular tachycardia (HCC) RR is normal  2. Supervision of other normal pregnancy, antepartum UTD currently  Encouraged to make all appts until end of pregnancy.   3. Anemia of mother in pregnancy, antepartum -Takign BID Fe, last Hgb was 9.6 in Oct. Now on ~2 months of therapy. Will check again. Plan to increase to TID if <10.5 - CBC  4. Back pain affecting pregnancy in third trimester Recommended belly band, warm bath, stretches.   Preterm  labor symptoms and general obstetric precautions including but not limited to vaginal bleeding, contractions, leaking of fluid and fetal movement were reviewed in detail with the patient. Please refer to After Visit Summary for other counseling recommendations.  Return in about 2 weeks (around 07/03/2017) for Routine prenatal care.   Federico FlakeKimberly Niles Ketina Mars, MD

## 2017-06-20 LAB — CBC
HEMATOCRIT: 30.7 % — AB (ref 34.0–46.6)
Hemoglobin: 9.4 g/dL — ABNORMAL LOW (ref 11.1–15.9)
MCH: 23.9 pg — ABNORMAL LOW (ref 26.6–33.0)
MCHC: 30.6 g/dL — AB (ref 31.5–35.7)
MCV: 78 fL — ABNORMAL LOW (ref 79–97)
PLATELETS: 214 10*3/uL (ref 150–379)
RBC: 3.94 x10E6/uL (ref 3.77–5.28)
RDW: 16.3 % — AB (ref 12.3–15.4)
WBC: 7.9 10*3/uL (ref 3.4–10.8)

## 2017-06-27 ENCOUNTER — Other Ambulatory Visit: Payer: Self-pay | Admitting: Family Medicine

## 2017-06-27 DIAGNOSIS — O99019 Anemia complicating pregnancy, unspecified trimester: Secondary | ICD-10-CM

## 2017-06-27 MED ORDER — SODIUM CHLORIDE 0.9 % IV SOLN: 500.0000 mg | Freq: Once | INTRAVENOUS | Status: DC

## 2017-06-28 ENCOUNTER — Telehealth: Payer: Self-pay

## 2017-06-28 ENCOUNTER — Other Ambulatory Visit: Payer: Self-pay | Admitting: Family Medicine

## 2017-06-28 NOTE — Progress Notes (Signed)
Added inpatient feraheme order

## 2017-06-28 NOTE — Telephone Encounter (Signed)
Called patient to inform her of low iron and need to have iron infusion at outpatient center. No answer will call back at a later time.

## 2017-06-28 NOTE — Telephone Encounter (Signed)
-----   Message from Kimberly Niles Newton, MD sent at 06/25/2017  1:15 PM EST ----- Patient has persistent anemia despite Fe treatment. Can you help me set up an iron infusion for the patient? 

## 2017-06-28 NOTE — Telephone Encounter (Signed)
Awaiting for provider to put in orders

## 2017-07-03 ENCOUNTER — Other Ambulatory Visit (HOSPITAL_COMMUNITY)
Admission: RE | Admit: 2017-07-03 | Discharge: 2017-07-03 | Disposition: A | Discharge status: Home / Self Care | Payer: Medicaid Other | Source: Ambulatory Visit | Attending: Obstetrics and Gynecology | Admitting: Obstetrics and Gynecology

## 2017-07-03 ENCOUNTER — Ambulatory Visit (INDEPENDENT_AMBULATORY_CARE_PROVIDER_SITE_OTHER): Payer: Medicaid Other | Admitting: Obstetrics and Gynecology

## 2017-07-03 ENCOUNTER — Telehealth: Payer: Self-pay

## 2017-07-03 DIAGNOSIS — Z3483 Encounter for supervision of other normal pregnancy, third trimester: Secondary | ICD-10-CM | POA: Diagnosis not present

## 2017-07-03 DIAGNOSIS — Z3480 Encounter for supervision of other normal pregnancy, unspecified trimester: Secondary | ICD-10-CM

## 2017-07-03 DIAGNOSIS — O99013 Anemia complicating pregnancy, third trimester: Secondary | ICD-10-CM

## 2017-07-03 DIAGNOSIS — O99019 Anemia complicating pregnancy, unspecified trimester: Secondary | ICD-10-CM

## 2017-07-03 LAB — OB RESULTS CONSOLE GC/CHLAMYDIA: Gonorrhea: NEGATIVE

## 2017-07-03 NOTE — Progress Notes (Signed)
Prenatal Visit Note Date: 07/03/2017 Clinic: [facility]  Subjective:  Tabitha Case is a 21 y.o. G3P2002 at 7734w3d being seen today for ongoing prenatal care.  She is currently monitored for the following issues for this low-risk pregnancy and has Supervision of other normal pregnancy, antepartum; Supraventricular tachycardia (HCC); and Anemia of mother in pregnancy, antepartum on their problem list.  Patient reports no complaints.   Contractions: Irregular. Vag. Bleeding: None.  Movement: Present. Denies leaking of fluid.   The following portions of the patient's history were reviewed and updated as appropriate: allergies, current medications, past family history, past medical history, past social history, past surgical history and problem list. Problem list updated.  Objective:   Vitals:   07/03/17 0826  BP: 106/68  Pulse: 89  Weight: 202 lb 3.2 oz (91.7 kg)    Fetal Status: Fetal Heart Rate (bpm): 140 Fundal Height: 35 cm Movement: Present  Presentation: Vertex  General:  Alert, oriented and cooperative. Patient is in no acute distress.  Skin: Skin is warm and dry. No rash noted.   Cardiovascular: Normal heart rate noted  Respiratory: Normal respiratory effort, no problems with respiration noted  Abdomen: Soft, gravid, appropriate for gestational age. Pain/Pressure: Present     Pelvic:  Cervical exam deferred        Extremities: Normal range of motion.  Edema: None  Mental Status: Normal mood and affect. Normal behavior. Normal judgment and thought content.   Urinalysis:      Assessment and Plan:  Pregnancy: G3P2002 at 3534w3d  1. Supervision of other normal pregnancy, antepartum Routine care. Unsure about bc - Strep Gp B NAA - Cervicovaginal ancillary only  2. Anemia of mother in pregnancy, antepartum Follow up if taking iron nv.   Preterm labor symptoms and general obstetric precautions including but not limited to vaginal bleeding, contractions,  leaking of fluid and fetal movement were reviewed in detail with the patient. Please refer to After Visit Summary for other counseling recommendations.  Return in about 1 week (around 07/10/2017) for 7-10d rob.   [place] BingPickens, Jelina Paulsen, MD

## 2017-07-03 NOTE — Telephone Encounter (Signed)
Spoke with patient regarding hemoglobin and the need to start iron infusion. Patient levels are 9.4 right now and patient does not feel she needs to start  Iron infusion. She stated it would not be necessary to do this now. Patient is currently taking iron supplements bid everyday with orange juice. She would like to try this for now.

## 2017-07-04 ENCOUNTER — Telehealth: Payer: Self-pay

## 2017-07-04 ENCOUNTER — Encounter (HOSPITAL_COMMUNITY): Payer: Self-pay | Admitting: *Deleted

## 2017-07-04 ENCOUNTER — Inpatient Hospital Stay (HOSPITAL_COMMUNITY)
Admission: AD | Admit: 2017-07-04 | Discharge: 2017-07-05 | DRG: 832 | Disposition: A | Discharge status: Home / Self Care | Payer: Medicaid Other | Source: Ambulatory Visit | Attending: Obstetrics and Gynecology | Admitting: Obstetrics and Gynecology

## 2017-07-04 DIAGNOSIS — O99413 Diseases of the circulatory system complicating pregnancy, third trimester: Secondary | ICD-10-CM | POA: Diagnosis present

## 2017-07-04 DIAGNOSIS — I471 Supraventricular tachycardia: Secondary | ICD-10-CM | POA: Diagnosis present

## 2017-07-04 DIAGNOSIS — Z3480 Encounter for supervision of other normal pregnancy, unspecified trimester: Secondary | ICD-10-CM

## 2017-07-04 DIAGNOSIS — Z3A36 36 weeks gestation of pregnancy: Secondary | ICD-10-CM

## 2017-07-04 DIAGNOSIS — Z79899 Other long term (current) drug therapy: Secondary | ICD-10-CM

## 2017-07-04 DIAGNOSIS — O99013 Anemia complicating pregnancy, third trimester: Secondary | ICD-10-CM | POA: Diagnosis present

## 2017-07-04 DIAGNOSIS — D649 Anemia, unspecified: Secondary | ICD-10-CM | POA: Diagnosis present

## 2017-07-04 LAB — CERVICOVAGINAL ANCILLARY ONLY
Chlamydia: NEGATIVE
NEISSERIA GONORRHEA: NEGATIVE

## 2017-07-04 LAB — WET PREP, GENITAL
Clue Cells Wet Prep HPF POC: NONE SEEN
SPERM: NONE SEEN
TRICH WET PREP: NONE SEEN
YEAST WET PREP: NONE SEEN

## 2017-07-04 MED ORDER — LACTATED RINGERS IV BOLUS (SEPSIS)
1000.0000 mL | Freq: Once | INTRAVENOUS | Status: AC
  Administered 2017-07-04: 1000 mL via INTRAVENOUS

## 2017-07-04 NOTE — Telephone Encounter (Signed)
Call patient in regards to iron infusion and low hemoglobin. Patient would like to hold off on iron infusion at this time. She will continued to take ferrous iron bid with orange juice. Advised Dr.Pickens of patient discussion.

## 2017-07-04 NOTE — MAU Note (Signed)
Pt presents to MAU c/o vaginal pressure, ctxs every , and possible LOF. Pt reports good FM. Denies vaginal bleeding.

## 2017-07-04 NOTE — Telephone Encounter (Signed)
-----   Message from Federico FlakeKimberly Niles Newton, MD sent at 06/25/2017  1:15 PM EST ----- Patient has persistent anemia despite Fe treatment. Can you help me set up an iron infusion for the patient?

## 2017-07-04 NOTE — MAU Provider Note (Signed)
History  CSN: [id number] Arrival date and time: 07/04/17 2054  None     Chief Complaint  Patient presents with  . Labor Eval    HPI: Tabitha Case is a 21 y.o. [id number] with IUP at 4777w4d who presents to maternity admissions reporting ctx and LOF. Reports noticing a small LOF in the morning, only got underwear wet, but did not leak through her clothes. Has been feeling regular contractions since about 2 am. Denies vaginal bleeding. Reports good fetal movement.   Also denies any abnormal vaginal discharge, vaginal bleeding, fevers, chills, malaise, dysuria, hematuria, urinary frequency, nausea, vomiting, diarrhea, RUQ/epigastric pain, dizziness/lighreadhess, or headache.   [facility]. Pregnancy complicated by anemia, SVT.   OB History  Gravida Para Term Preterm AB Living  3 2 2     2   SAB TAB Ectopic Multiple Live Births          2    # Outcome Date GA Lbr Len/2nd Weight Sex Delivery Anes PTL Lv  3 Current           2 Term 04/26/14 2882w0d 00:28 / 00:19 5 lb 10.1 oz (2.554 kg) F Vag-Spont EPI  LIV  1 Term 06/04/13 5546w6d 14:21 / 00:25 7 lb 0.5 oz (3.189 kg) F Vag-Spont None  LIV     Past Medical History:  Diagnosis Date  . Ringworm    Past Surgical History:  Procedure Laterality Date  . WISDOM TOOTH EXTRACTION     Family History  Family history unknown: Yes   Social History   Socioeconomic History  . Marital status: Single    Spouse name: Not on file  . Number of children: Not on file  . Years of education: Not on file  . Highest education level: Not on file  Social Needs  . Financial resource strain: Not on file  . Food insecurity - worry: Not on file  . Food insecurity - inability: Not on file  . Transportation needs - medical: Not on file  . Transportation needs - non-medical: Not on file  Occupational History  . Not on file  Tobacco Use  . Smoking status: Never Smoker  . Smokeless tobacco: Never Used  Substance and Sexual Activity  .  Alcohol use: No  . Drug use: No  . Sexual activity: Yes    Birth control/protection: None  Other Topics Concern  . Not on file  Social History Narrative  . Not on file   No Known Allergies  Medications Prior to Admission  Medication Sig Dispense Refill Last Dose  . acetaminophen (TYLENOL) 500 MG tablet Take 1,000 mg by mouth every 6 (six) hours as needed for mild pain or headache.   Past Month at Unknown time  . ferrous gluconate (FERGON) 324 MG tablet Take 1 tablet (324 mg total) by mouth 2 (two) times daily with a meal. 60 tablet 3 07/04/2017 at Unknown time  . Prenatal Vit-Fe Fumarate-FA (PRENATAL MULTIVITAMIN) TABS tablet Take 1 tablet by mouth daily at 12 noon.   07/04/2017 at Unknown time  . docusate sodium (COLACE) 100 MG capsule Take 1 capsule (100 mg total) by mouth daily. Increase to twice per day as needed 60 capsule 3 Taking    I have reviewed patient's Past Medical Hx, Surgical Hx, Family Hx, Social Hx, medications and allergies.   Review of Systems: Negative except for what is mentioned in HPI.  Physical Exam   Blood pressure 121/75, pulse (!) 116, temperature 98.5 F (36.9  C), resp. rate 18, height 5\' 9"  (1.753 m), weight 204 lb (92.5 kg), last menstrual period 10/21/2016, currently breastfeeding.  Constitutional: Well-developed, well-nourished female in no acute distress.  HENT: [place]/AT, normal oropharynx mucosa. MMM Eyes: normal conjunctivae, no scleral icterus Cardiovascular: normal rate, regular rhythm Respiratory: normal effort, lungs CTAB.  GI: Abd soft, non-tender, gravid appropriate for gestational age.   GU: Neg CVAT. Pelvic: NEFG. Normal vaginal mucosa without lesions. Cervix pink, visually closed, without lesion, no pooling, small amount of vaginal discharge SVE: 2 cm/thick/high MSK: Extremities nontender, no edema Neurologic: Alert and oriented x 4. Psych: Normal mood and affect Skin: warm and dry   FHT:  Baseline 140 , moderate variability,  accelerations present, occasional variable decelerations Toco: Contractions: q 2-4 mins  MAU Course/MDM:   Nursing notes and VS reviewed. Patient seen and examined, as noted above.  No pooling on exam. Ferning negative by RN, and repeated by me also negative.  IVFs fluids given.  Repeat SVE 3-3.5cm/30%/-3, blood show    Assessment and Plan   Spoke with Dr. Adrian BlackwaterStinson. Pt in latent PTL and Cat II strip. Will admit to L&D. Notified midwife in L&D.     Shauntea Lok, Kandra NicolasJulie P, MD 07/05/2017 1:47 AM

## 2017-07-05 ENCOUNTER — Encounter (HOSPITAL_COMMUNITY): Payer: Self-pay

## 2017-07-05 DIAGNOSIS — D649 Anemia, unspecified: Secondary | ICD-10-CM | POA: Diagnosis present

## 2017-07-05 DIAGNOSIS — Z3A36 36 weeks gestation of pregnancy: Secondary | ICD-10-CM | POA: Diagnosis not present

## 2017-07-05 DIAGNOSIS — Z79899 Other long term (current) drug therapy: Secondary | ICD-10-CM | POA: Diagnosis not present

## 2017-07-05 DIAGNOSIS — O99413 Diseases of the circulatory system complicating pregnancy, third trimester: Secondary | ICD-10-CM | POA: Diagnosis present

## 2017-07-05 DIAGNOSIS — I471 Supraventricular tachycardia: Secondary | ICD-10-CM | POA: Diagnosis present

## 2017-07-05 DIAGNOSIS — O99013 Anemia complicating pregnancy, third trimester: Secondary | ICD-10-CM | POA: Diagnosis present

## 2017-07-05 LAB — CBC
HCT: 28.9 % — ABNORMAL LOW (ref 36.0–46.0)
Hemoglobin: 9.3 g/dL — ABNORMAL LOW (ref 12.0–15.0)
MCH: 24.3 pg — AB (ref 26.0–34.0)
MCHC: 32.2 g/dL (ref 30.0–36.0)
MCV: 75.7 fL — AB (ref 78.0–100.0)
PLATELETS: 219 10*3/uL (ref 150–400)
RBC: 3.82 MIL/uL — ABNORMAL LOW (ref 3.87–5.11)
RDW: 15.7 % — AB (ref 11.5–15.5)
WBC: 8.6 10*3/uL (ref 4.0–10.5)

## 2017-07-05 LAB — OB RESULTS CONSOLE GBS: GBS: NEGATIVE

## 2017-07-05 LAB — TYPE AND SCREEN
ABO/RH(D): O POS
Antibody Screen: NEGATIVE

## 2017-07-05 LAB — RPR: RPR: NONREACTIVE

## 2017-07-05 LAB — ABO/RH: ABO/RH(D): O POS

## 2017-07-05 LAB — STREP GP B NAA: STREP GROUP B AG: NEGATIVE

## 2017-07-05 MED ORDER — LIDOCAINE HCL (PF) 1 % IJ SOLN: 30.0000 mL | INTRAMUSCULAR | Status: DC | PRN

## 2017-07-05 MED ORDER — FLEET ENEMA 7-19 GM/118ML RE ENEM: 1.0000 | ENEMA | RECTAL | Status: DC | PRN

## 2017-07-05 MED ORDER — ACETAMINOPHEN 325 MG PO TABS: 650.0000 mg | ORAL_TABLET | ORAL | Status: DC | PRN

## 2017-07-05 MED ORDER — HYDROXYZINE HCL 50 MG PO TABS: 50.0000 mg | ORAL_TABLET | Freq: Four times a day (QID) | ORAL | Status: DC | PRN

## 2017-07-05 MED ORDER — FENTANYL CITRATE (PF) 100 MCG/2ML IJ SOLN: 50.0000 ug | INTRAMUSCULAR | Status: DC | PRN

## 2017-07-05 MED ORDER — OXYTOCIN BOLUS FROM INFUSION: 500.0000 mL | Freq: Once | INTRAVENOUS | Status: DC

## 2017-07-05 MED ORDER — OXYCODONE-ACETAMINOPHEN 5-325 MG PO TABS: 1.0000 | ORAL_TABLET | ORAL | Status: DC | PRN

## 2017-07-05 MED ORDER — LACTATED RINGERS IV SOLN: 500.0000 mL | INTRAVENOUS | Status: DC | PRN

## 2017-07-05 MED ORDER — BETAMETHASONE SOD PHOS & ACET 6 (3-3) MG/ML IJ SUSP
12.5000 mg | Freq: Once | INTRAMUSCULAR | Status: AC
  Administered 2017-07-05: 12.5 mg via INTRAMUSCULAR
  Filled 2017-07-05: qty 2.08

## 2017-07-05 MED ORDER — ONDANSETRON HCL 4 MG/2ML IJ SOLN: 4.0000 mg | Freq: Four times a day (QID) | INTRAMUSCULAR | Status: DC | PRN

## 2017-07-05 MED ORDER — SOD CITRATE-CITRIC ACID 500-334 MG/5ML PO SOLN
30.0000 mL | ORAL | Status: DC | PRN
  Administered 2017-07-05: 30 mL via ORAL
  Filled 2017-07-05: qty 15

## 2017-07-05 MED ORDER — DEXTROSE 5 % IV SOLN
5.0000 10*6.[IU] | Freq: Once | INTRAVENOUS | Status: AC
  Administered 2017-07-05: 5 10*6.[IU] via INTRAVENOUS
  Filled 2017-07-05: qty 5

## 2017-07-05 MED ORDER — OXYCODONE-ACETAMINOPHEN 5-325 MG PO TABS: 2.0000 | ORAL_TABLET | ORAL | Status: DC | PRN

## 2017-07-05 MED ORDER — PENICILLIN G POT IN DEXTROSE 60000 UNIT/ML IV SOLN
3.0000 10*6.[IU] | INTRAVENOUS | Status: DC
  Administered 2017-07-05: 3 10*6.[IU] via INTRAVENOUS
  Filled 2017-07-05 (×2): qty 50

## 2017-07-05 MED ORDER — OXYTOCIN 40 UNITS IN LACTATED RINGERS INFUSION - SIMPLE MED: 2.5000 [IU]/h | INTRAVENOUS | Status: DC

## 2017-07-05 MED ORDER — LACTATED RINGERS IV SOLN
INTRAVENOUS | Status: DC
  Administered 2017-07-05: 03:00:00 via INTRAVENOUS

## 2017-07-05 NOTE — Discharge Instructions (Signed)

## 2017-07-05 NOTE — Discharge Summary (Signed)
OB Discharge Summary     Patient Name: Tabitha Case DOB: 1995/07/29 MRN: [id number] Date of admission: 07/04/2017  Delivering MD: This patient has no babies on file.)  Date of discharge: 07/05/2017    Admitting diagnosis: preterm contractions Intrauterine pregnancy: 4743w5d    Secondary diagnosis:  Active Problems:   Patient Active Problem List   Diagnosis Date Noted  . Anemia of mother in pregnancy, antepartum 06/05/2017  . Supraventricular tachycardia (HCC) 03/16/2017  . Supervision of other normal pregnancy, antepartum 01/01/2017       Discharge diagnosis: preterm contractions                                    Hospital course:  Admitted for preterm contractions and variable deceleration in MAU. Given betamethasone x 1. Cervix remained unchanged during overnight stay. Patient is comfortable and no contractions. EFM shows cat 1. Patient discharged home in stable condition. Follow up as outpatient on 07/10/17.  Physical exam  Vitals:   07/05/17 0641 07/05/17 0801  BP: 113/60 (!) 90/49  Pulse: 87 94  Resp: 17   Temp: 98.1 F (36.7 C) 98.5 F (36.9 C)  SpO2:      General: alert, cooperative and no distress Cervix: 3/thick/-3  Labs: Results for orders placed or performed during the hospital encounter of 07/04/17 (from the past 24 hour(s))  Wet prep, genital     Status: Abnormal   Collection Time: 07/04/17  9:50 PM  Result Value Ref Range   Yeast Wet Prep HPF POC NONE SEEN NONE SEEN   Trich, Wet Prep NONE SEEN NONE SEEN   Clue Cells Wet Prep HPF POC NONE SEEN NONE SEEN   WBC, Wet Prep HPF POC MODERATE (A) NONE SEEN   Sperm NONE SEEN   OB RESULT CONSOLE Group B Strep     Status: None   Collection Time: 07/05/17 12:00 AM  Result Value Ref Range   GBS Negative   CBC     Status: Abnormal   Collection Time: 07/05/17  2:00 AM  Result Value Ref Range   WBC 8.6 4.0 - 10.5 K/uL   RBC 3.82 (L) 3.87 - 5.11 MIL/uL   Hemoglobin 9.3 (L) 12.0 - 15.0 g/dL   HCT 78.228.9 (L) 95.636.0  - 46.0 %   MCV 75.7 (L) 78.0 - 100.0 fL   MCH 24.3 (L) 26.0 - 34.0 pg   MCHC 32.2 30.0 - 36.0 g/dL   RDW 21.315.7 (H) 08.611.5 - 57.815.5 %   Platelets 219 150 - 400 K/uL  Type and screen [facility]     Status: None   Collection Time: 07/05/17  2:00 AM  Result Value Ref Range   ABO/RH(D) O POS    Antibody Screen NEG    Sample Expiration 07/08/2017   ABO/Rh     Status: None   Collection Time: 07/05/17  2:00 AM  Result Value Ref Range   ABO/RH(D) O POS       After visit meds:  No Known Allergies  Allergies as of 07/05/2017   No Known Allergies     Medication List    STOP taking these medications   docusate sodium 100 MG capsule Commonly known as:  COLACE     TAKE these medications   acetaminophen 500 MG tablet Commonly known as:  TYLENOL Take 1,000 mg by mouth every 6 (six) hours as needed for mild pain or headache.  ferrous gluconate 324 MG tablet Commonly known as:  FERGON Take 1 tablet (324 mg total) by mouth 2 (two) times daily with a meal.   prenatal multivitamin Tabs tablet Take 1 tablet by mouth daily at 12 noon.        Diet: routine diet  Future Appointments:  Future Appointments  Date Time Provider Department Center  07/10/2017  9:00 AM Melvin BingPickens, Charlie, MD [facility]  07/17/2017  9:45 AM Federico FlakeNewton, Kimberly Niles, MD [facility]  07/24/2017  9:15 AM  BingPickens, Charlie, MD [facility]     Tabitha Switala GoldendaleMoss, DO  07/05/2017

## 2017-07-05 NOTE — H&P (Signed)
Tabitha LoreRozjae Feig is a 21 y.o. female [id number] with IUP at 8221w5d presenting for contractions.She is being admitted from the MAU after a few hours of obs with some cervical change and 1 variable decel for 60 seconds.   [facility]  Prenatal History/Complications:  none  Past Medical History: Past Medical History:  Diagnosis Date  . Ringworm     Past Surgical History: Past Surgical History:  Procedure Laterality Date  . WISDOM TOOTH EXTRACTION      Obstetrical History: OB History    Gravida Para Term Preterm AB Living   3 2 2     2    SAB TAB Ectopic Multiple Live Births           2       Social History: Social History   Socioeconomic History  . Marital status: Single    Spouse name: None  . Number of children: None  . Years of education: None  . Highest education level: None  Social Needs  . Financial resource strain: None  . Food insecurity - worry: None  . Food insecurity - inability: None  . Transportation needs - medical: None  . Transportation needs - non-medical: None  Occupational History  . None  Tobacco Use  . Smoking status: Never Smoker  . Smokeless tobacco: Never Used  Substance and Sexual Activity  . Alcohol use: No  . Drug use: No  . Sexual activity: Yes    Birth control/protection: None  Other Topics Concern  . None  Social History Narrative  . None    Family History: Family History  Family history unknown: Yes    Allergies: No Known Allergies  Medications Prior to Admission  Medication Sig Dispense Refill Last Dose  . acetaminophen (TYLENOL) 500 MG tablet Take 1,000 mg by mouth every 6 (six) hours as needed for mild pain or headache.   Past Month at Unknown time  . ferrous gluconate (FERGON) 324 MG tablet Take 1 tablet (324 mg total) by mouth 2 (two) times daily with a meal. 60 tablet 3 07/04/2017 at Unknown time  . Prenatal Vit-Fe Fumarate-FA (PRENATAL MULTIVITAMIN) TABS tablet Take 1 tablet by mouth daily at 12 noon.    07/04/2017 at Unknown time  . docusate sodium (COLACE) 100 MG capsule Take 1 capsule (100 mg total) by mouth daily. Increase to twice per day as needed 60 capsule 3 Taking        Review of Systems   Constitutional: Negative for fever and chills Eyes: Negative for visual disturbances Respiratory: Negative for shortness of breath, dyspnea Cardiovascular: Negative for chest pain or palpitations  Gastrointestinal: Negative for vomiting, diarrhea and constipation.  POSITIVE for abdominal pain (contractions) Genitourinary: Negative for dysuria and urgency Musculoskeletal: Negative for back pain, joint pain, myalgias  Neurological: Negative for dizziness and headaches      Blood pressure 121/75, pulse (!) 116, temperature 98.5 F (36.9 C), resp. rate 18, height 5\' 9"  (1.753 m), weight 92.5 kg (204 lb), last menstrual period 10/21/2016, currently breastfeeding. General appearance: alert, cooperative and no distress Lungs: clear to auscultation bilaterally Heart: regular rate and rhythm Abdomen: soft, non-tender; bowel sounds normal Extremities: Homans sign is negative, no sign of DVT DTR's 2+ Presentation: cephalic Fetal monitoring  Baseline: 150 bpm, Variability: Good {> 6 bpm), Accelerations: Reactive and Decelerations: Absent Uterine activity  2-7 Dilation: 3 Effacement (%): 30 Exam by:: julie degele md   Prenatal labs: ABO, Rh: O/Positive/-- (06/26 1654) Antibody: Negative (06/26 1654) Rubella:  4.28 (06/26 1654) RPR: Non Reactive (10/25 0832)  HBsAg: Negative (06/26 1654)  HIV: Non Reactive (10/25 16100832)    Prenatal Transfer Tool  Maternal Diabetes: No Genetic Screening: Normal Maternal Ultrasounds/Referrals: Normal Fetal Ultrasounds or other Referrals:  None Maternal Substance Abuse:  No Significant Maternal Medications:  None Significant Maternal Lab Results: None     Results for orders placed or performed during the hospital encounter of 07/04/17 (from the  past 24 hour(s))  Wet prep, genital   Collection Time: 07/04/17  9:50 PM  Result Value Ref Range   Yeast Wet Prep HPF POC NONE SEEN NONE SEEN   Trich, Wet Prep NONE SEEN NONE SEEN   Clue Cells Wet Prep HPF POC NONE SEEN NONE SEEN   WBC, Wet Prep HPF POC MODERATE (A) NONE SEEN   Sperm NONE SEEN    [facility] Prenatal Labs  Dating LMP + 10 wks Blood type: O/Positive/-- (06/26 1654)   Genetic Screen declines Antibody:Negative (06/26 1654)  Anatomic US WNl but incomplete-f/u 4-6 wks-->neg Rubella: 4.28 (06/26 1654)  GTT Early:A1C 5.4    Third trimester: neg  RPR: Non Reactive (06/26 1654)   Flu vaccine 04/2017 HBsAg: Negative (06/26 1654)   TDaP vaccine  06/05/17                                              HIV: Non Reactive (06/26 1654)   Baby Food Breast                                          GBS: (For PCN allergy, check sensitivities)  Contraception Undecided 06/05/17 Pap:  Circumcision Girl fetus   [facility]   Support Person Shirlee LimerickMarion   Prenatal Classes       Assessment: Tabitha Case is a 21 y.o. [id number] with an IUP at 765w5d presenting for probable early labor, variable decels  Plan: #Labor: expectant management #Pain:  Per request #FWB Cat 1 #ID: GBS: pending   CRESENZO-DISHMAN,Neveyah Garzon 07/05/2017, 1:48 AM

## 2017-07-09 NOTE — L&D Delivery Note (Signed)
Delivery Note At 2:23 AM a viable female was delivered via ROA. APGAR: 9 ,9:weight pending.   Placenta status:delivered via Veatrice KellsShultz. Cord: 3v with the following complications:none. Cord pH: n/a  Anesthesia:None   Episiotomy: none   Lacerations:periurethral-hemostatic  Suture Repair: none Est. Blood Loss (mL):200    Mom to postpartum.  Baby to Couplet care / Skin to Skin.  Tabitha Case 07/23/2017, 2:53 AM  I attest that I was gloved and present at this delivery; I agree with the above documentation and findings.   Luna KitchensKathryn Kooistra CNM   Please schedule this patient for Postpartum visit in: 6 weeks with the following provider: APP  For C/S patients schedule nurse incision check in weeks 2 weeks: no  Low risk pregnancy complicated by: nothing  Delivery mode: SVD  Anticipated Birth Control: other/unsure  PP Procedures needed: pap  Schedule Integrated BH visit: no

## 2017-07-10 ENCOUNTER — Encounter: Payer: Medicaid Other | Admitting: Obstetrics and Gynecology

## 2017-07-16 ENCOUNTER — Ambulatory Visit (INDEPENDENT_AMBULATORY_CARE_PROVIDER_SITE_OTHER): Payer: Medicaid Other | Admitting: Obstetrics & Gynecology

## 2017-07-16 VITALS — BP 109/62 | HR 99 | Wt 202.0 lb

## 2017-07-16 DIAGNOSIS — Z3480 Encounter for supervision of other normal pregnancy, unspecified trimester: Secondary | ICD-10-CM

## 2017-07-16 NOTE — Progress Notes (Signed)
   PRENATAL VISIT NOTE  Subjective:  Tabitha Case is a 22 y.o. G3P2002 at 6521w2d being seen today for ongoing prenatal care.  She is currently monitored for the following issues for this low-risk pregnancy and has Supervision of other normal pregnancy, antepartum; Supraventricular tachycardia (HCC); and Anemia of mother in pregnancy, antepartum on their problem list.  Patient reports no complaints.   .  .  Movement: Present. Denies leaking of fluid.   The following portions of the patient's history were reviewed and updated as appropriate: allergies, current medications, past family history, past medical history, past social history, past surgical history and problem list. Problem list updated.  Objective:   Vitals:   07/16/17 1106  BP: 109/62  Pulse: 99  Weight: 202 lb (91.6 kg)    Fetal Status: Fetal Heart Rate (bpm): 133   Movement: Present     General:  Alert, oriented and cooperative. Patient is in no acute distress.  Skin: Skin is warm and dry. No rash noted.   Cardiovascular: Normal heart rate noted  Respiratory: Normal respiratory effort, no problems with respiration noted  Abdomen: Soft, gravid, appropriate for gestational age.  Pain/Pressure: Present     Pelvic: Cervical exam performed        Extremities: Normal range of motion.  Edema: None  Mental Status:  Normal mood and affect. Normal behavior. Normal judgment and thought content.   Assessment and Plan:  Pregnancy: G3P2002 at 7121w2d  1. Supervision of other normal pregnancy, antepartum   Term labor symptoms and general obstetric precautions including but not limited to vaginal bleeding, contractions, leaking of fluid and fetal movement were reviewed in detail with the patient. Please refer to After Visit Summary for other counseling recommendations.  No Follow-up on file.   Allie BossierMyra C Xaine Sansom, MD

## 2017-07-17 ENCOUNTER — Encounter: Payer: Medicaid Other | Admitting: Family Medicine

## 2017-07-21 ENCOUNTER — Inpatient Hospital Stay (HOSPITAL_COMMUNITY)
Admission: AD | Admit: 2017-07-21 | Discharge: 2017-07-21 | Disposition: A | Discharge status: Home / Self Care | Payer: Medicaid Other | Source: Ambulatory Visit | Attending: Obstetrics & Gynecology | Admitting: Obstetrics & Gynecology

## 2017-07-21 ENCOUNTER — Encounter (HOSPITAL_COMMUNITY): Payer: Self-pay

## 2017-07-21 ENCOUNTER — Other Ambulatory Visit: Payer: Self-pay

## 2017-07-21 DIAGNOSIS — Z3A39 39 weeks gestation of pregnancy: Secondary | ICD-10-CM

## 2017-07-21 DIAGNOSIS — Z0371 Encounter for suspected problem with amniotic cavity and membrane ruled out: Secondary | ICD-10-CM

## 2017-07-21 DIAGNOSIS — O9A213 Injury, poisoning and certain other consequences of external causes complicating pregnancy, third trimester: Secondary | ICD-10-CM | POA: Diagnosis not present

## 2017-07-21 DIAGNOSIS — W000XXA Fall on same level due to ice and snow, initial encounter: Secondary | ICD-10-CM | POA: Diagnosis not present

## 2017-07-21 DIAGNOSIS — R1030 Lower abdominal pain, unspecified: Secondary | ICD-10-CM | POA: Diagnosis not present

## 2017-07-21 DIAGNOSIS — O26893 Other specified pregnancy related conditions, third trimester: Secondary | ICD-10-CM | POA: Insufficient documentation

## 2017-07-21 LAB — URINALYSIS, ROUTINE W REFLEX MICROSCOPIC
BILIRUBIN URINE: NEGATIVE
Glucose, UA: NEGATIVE mg/dL
Hgb urine dipstick: NEGATIVE
Ketones, ur: NEGATIVE mg/dL
Nitrite: NEGATIVE
PH: 6 (ref 5.0–8.0)
Protein, ur: 30 mg/dL — AB
SPECIFIC GRAVITY, URINE: 1.015 (ref 1.005–1.030)

## 2017-07-21 LAB — POCT FERN TEST: POCT Fern Test: NEGATIVE

## 2017-07-21 NOTE — Progress Notes (Addendum)
G3P2 @ [redacted] wksga. Here due to a fall last night on some ice and LOF. Denies bleeding. +FM  EFM applied SVE: unable to reach cervix  1325: Provider at bs assessing. Speculum exam done  SVE 4.5/60/-2  1542: provider at bs re-assessing. SVE unchanged. Labor precaution d/c orders given with pt understanding.   1554: pt left unit via ambulatory

## 2017-07-21 NOTE — MAU Provider Note (Signed)
History     CSN: [id number]  Arrival date and time: 07/21/17 1228   First Provider Initiated Contact with Patient 07/21/17 1319      Chief Complaint  Patient presents with  . Fall  . Labor Eval   HPI Gypsy LoreRozjae Goodridge is a 22 y.o. G3P2002 at 336w0d who presents with LOF. Reports falling last night in the ice. Fall occurred around 11 pm. States she slipped in the ice & landed on her left hip. After the fall she noticed a "gush" of clear fluid. States leaking has not continued. Denies vaginal bleeding. Reports occasional lower abdominal cramping. Positive fetal movement.   OB History    Gravida Para Term Preterm AB Living   3 2 2     2    SAB TAB Ectopic Multiple Live Births           2      Past Medical History:  Diagnosis Date  . Ringworm     Past Surgical History:  Procedure Laterality Date  . WISDOM TOOTH EXTRACTION      Family History  Family history unknown: Yes    Social History   Tobacco Use  . Smoking status: Never Smoker  . Smokeless tobacco: Never Used  Substance Use Topics  . Alcohol use: No  . Drug use: No    Allergies: No Known Allergies  Medications Prior to Admission  Medication Sig Dispense Refill Last Dose  . acetaminophen (TYLENOL) 500 MG tablet Take 1,000 mg by mouth every 6 (six) hours as needed for mild pain or headache.   Not Taking  . ferrous gluconate (FERGON) 324 MG tablet Take 1 tablet (324 mg total) by mouth 2 (two) times daily with a meal. 60 tablet 3 Taking  . Prenatal Vit-Fe Fumarate-FA (PRENATAL MULTIVITAMIN) TABS tablet Take 1 tablet by mouth daily at 12 noon.   07/04/2017 at Unknown time    Review of Systems  Gastrointestinal: Positive for abdominal pain. Negative for diarrhea, nausea and vomiting.  Genitourinary: Positive for vaginal discharge. Negative for vaginal bleeding.   Physical Exam   Blood pressure 125/66, pulse 95, temperature 98.1 F (36.7 C), temperature source Oral, resp. rate 18, weight 199 lb (90.3 kg), last  menstrual period 10/21/2016, SpO2 100 %, currently breastfeeding.  Physical Exam  Nursing note and vitals reviewed. Constitutional: She is oriented to person, place, and time. She appears well-developed and well-nourished. No distress.  HENT:  Head: Normocephalic and atraumatic.  Eyes: Conjunctivae are normal. Right eye exhibits no discharge. Left eye exhibits no discharge. No scleral icterus.  Neck: Normal range of motion.  Respiratory: Effort normal. No respiratory distress.  GI: Soft. There is no tenderness.  Abdomen soft & non tender. No bruising. Contractions palpate mild with adequate resting tone.   Neurological: She is alert and oriented to person, place, and time.  Skin: Skin is warm and dry. She is not diaphoretic.  Psychiatric: She has a normal mood and affect. Her behavior is normal. Judgment and thought content normal.    MAU Course  Procedures Results for orders placed or performed during the hospital encounter of 07/21/17 (from the past 24 hour(s))  Urinalysis, Routine w reflex microscopic     Status: Abnormal   Collection Time: 07/21/17 12:45 PM  Result Value Ref Range   Color, Urine YELLOW YELLOW   APPearance HAZY (A) CLEAR   Specific Gravity, Urine 1.015 1.005 - 1.030   pH 6.0 5.0 - 8.0   Glucose, UA NEGATIVE NEGATIVE mg/dL  Hgb urine dipstick NEGATIVE NEGATIVE   Bilirubin Urine NEGATIVE NEGATIVE   Ketones, ur NEGATIVE NEGATIVE mg/dL   Protein, ur 30 (A) NEGATIVE mg/dL   Nitrite NEGATIVE NEGATIVE   Leukocytes, UA SMALL (A) NEGATIVE   RBC / HPF 0-5 0 - 5 RBC/hpf   WBC, UA TOO NUMEROUS TO COUNT 0 - 5 WBC/hpf   Bacteria, UA MANY (A) NONE SEEN   Squamous Epithelial / LPF 0-5 (A) NONE SEEN   Mucus PRESENT   Fern Test     Status: None   Collection Time: 07/21/17  2:07 PM  Result Value Ref Range   POCT Fern Test Negative = intact amniotic membranes     MDM NST:  Baseline: 140 bpm, Variability: Good {> 6 bpm), Accelerations: Reactive and Decelerations:  Absent  Abdomen soft & non tender, no bruising of abdomen. Reactive NST. No pooling of fluid on exam & fern negative.   Assessment and Plan  A; 1. Encounter for suspected PROM, with rupture of membranes not found   2. [redacted] weeks gestation of pregnancy   3. Traumatic injury during pregnancy in third trimester    P; Discharge home  Discussed reasons to return to MAU Keep f/u with OB Judeth Horn 07/21/2017, 1:19 PM

## 2017-07-21 NOTE — Discharge Instructions (Signed)
Preventing Injuries During Pregnancy °Trauma is the most common cause of injury and death in pregnant women. This can also result in serious harm to the baby or even death. °How can injuries affect my pregnancy? °Your baby is protected in the womb (uterus) by a sac filled with fluid (amniotic sac). Your baby can be harmed if there is a direct blow to your abdomen and pelvis. Trauma may be caused by: °· Falls. These are more common in the second and third trimester of pregnancy. °· Automobile accidents. °· Domestic violence or assault. °· Severe burns, such as from fire or electricity. ° °These injuries can result in: °· Tearing of your uterus. °· The placenta pulling away from the wall of the uterus (placental abruption). °· The amniotic sac breaking open (rupture of membranes). °· Blockage or decrease in the blood supply to your baby. °· Going into labor earlier than expected. °· Severe injuries to other parts of your body, such as your brain, spine, heart, lungs, or other organs. ° °Minor falls and low-impact automobile accidents do not usually harm your baby, even if they cause a little harm to you. °What can I do to lower my risk? °Safety °· Remove slippery rugs and loose objects on the floor. They increase your risk of tripping or slipping. °· Wear comfortable shoes that have a good grip on the sole. Do not wear high-heeled shoes. °· Always wear your seat belt properly when riding in a car. Use both the lap and shoulder belt, with the lap belt below your abdomen. Always practice safe driving. Do not ride on a motorcycle while pregnant. °Activity °· Avoid walking on wet or slippery floors. °· Do not participate in rough and violent activities or sports. °· Avoid high-risk situations and activities such as: °? Lifting heavy pots of boiling or hot liquids. °? Fixing electrical problems. °? Being near fires or starting fires. °General instructions °· Take over-the-counter and prescription medicines only as told by  your health care provider. °· Know your blood type and the father's blood type in case you develop vaginal bleeding or experience an injury for which a blood transfusion is needed. °· Spousal abuse can be a serious cause of trauma during pregnancy. If you are a victim of domestic violence or assault: °? Call your local emergency services (911 in the [place] °? Contact the National Domestic Violence Hotline for help and support. °When should I seek immediate medical care? °Get help right away if: °· You fall on your abdomen or experience any serious blow to your abdomen. °· You develop stiffness in your neck or pain after a fall or from other trauma. °· You develop a headache or vision problems after a fall or from other trauma. °· You do not feel the baby moving after a fall or trauma, or you feel that the baby is not moving as much as before the fall or trauma. °· You have been the victim of domestic violence or any other kind of physical attack. °· You have been in a car accident. °· You develop vaginal bleeding. °· You have fluid leaking from the vagina. °· You develop uterine contractions. Symptoms include pelvic cramping, pain, or serious low back pain. °· You become weak, faint, or have uncontrolled vomiting after trauma. °· You have a serious burn. This includes burns to the face, neck, hands, or genitals, or burns greater than the size of your palm anywhere else. ° °Summary °· Trauma is the most common cause of   injury and death in pregnant women and can also lead to injury or death of the baby. °· Falls, automobile accidents, domestic violence or assault, and severe burns can injure you or your baby. Make sure to get medical help right away if you experience any of these during your pregnancy. °· Take steps to prevent slips or falls in your home, such as avoiding slippery floors and removing loose rugs. °· Always wear your seat belt properly when riding in a car. Practice safe driving. °This information is  not intended to replace advice given to you by your health care provider. Make sure you discuss any questions you have with your health care provider. °Document Released: 08/02/2004 Document Revised: 07/04/2016 Document Reviewed: 07/04/2016 °Elsevier Interactive Patient Education © 2017 [employer] ° °Fetal Movement Counts °Patient Name: ________________________________________________ Patient Due Date: ____________________ °What is a fetal movement count? °A fetal movement count is the number of times that you feel your baby move during a certain amount of time. This may also be called a fetal kick count. A fetal movement count is recommended for every pregnant woman. You may be asked to start counting fetal movements as early as week 28 of your pregnancy. °Pay attention to when your baby is most active. You may notice your baby's sleep and wake cycles. You may also notice things that make your baby move more. You should do a fetal movement count: °· When your baby is normally most active. °· At the same time each day. ° °A good time to count movements is while you are resting, after having something to eat and drink. °How do I count fetal movements? °1. Find a quiet, comfortable area. Sit, or lie down on your side. °2. Write down the date, the start time and stop time, and the number of movements that you felt between those two times. Take this information with you to your health care visits. °3. For 2 hours, count kicks, flutters, swishes, rolls, and jabs. You should feel at least 10 movements during 2 hours. °4. You may stop counting after you have felt 10 movements. °5. If you do not feel 10 movements in 2 hours, have something to eat and drink. Then, keep resting and counting for 1 hour. If you feel at least 4 movements during that hour, you may stop counting. °Contact a health care provider if: °· You feel fewer than 4 movements in 2 hours. °· Your baby is not moving like he or she usually does. °Date:  ____________ Start time: ____________ Stop time: ____________ Movements: ____________ °Date: ____________ Start time: ____________ Stop time: ____________ Movements: ____________ °Date: ____________ Start time: ____________ Stop time: ____________ Movements: ____________ °Date: ____________ Start time: ____________ Stop time: ____________ Movements: ____________ °Date: ____________ Start time: ____________ Stop time: ____________ Movements: ____________ °Date: ____________ Start time: ____________ Stop time: ____________ Movements: ____________ °Date: ____________ Start time: ____________ Stop time: ____________ Movements: ____________ °Date: ____________ Start time: ____________ Stop time: ____________ Movements: ____________ °Date: ____________ Start time: ____________ Stop time: ____________ Movements: ____________ °This information is not intended to replace advice given to you by your health care provider. Make sure you discuss any questions you have with your health care provider. °Document Released: 07/25/2006 Document Revised: 02/22/2016 Document Reviewed: 08/04/2015 °Elsevier Interactive Patient Education © 2018 [employer] ° °

## 2017-07-23 ENCOUNTER — Encounter (HOSPITAL_COMMUNITY): Payer: Self-pay | Admitting: *Deleted

## 2017-07-23 ENCOUNTER — Other Ambulatory Visit: Payer: Self-pay

## 2017-07-23 ENCOUNTER — Inpatient Hospital Stay (HOSPITAL_COMMUNITY)
Admission: AD | Admit: 2017-07-23 | Discharge: 2017-07-25 | DRG: 807 | Disposition: A | Discharge status: Home / Self Care | Payer: Medicaid Other | Source: Ambulatory Visit | Attending: Family Medicine | Admitting: Family Medicine

## 2017-07-23 DIAGNOSIS — O99019 Anemia complicating pregnancy, unspecified trimester: Secondary | ICD-10-CM | POA: Diagnosis present

## 2017-07-23 DIAGNOSIS — D649 Anemia, unspecified: Secondary | ICD-10-CM | POA: Diagnosis present

## 2017-07-23 DIAGNOSIS — Z3A39 39 weeks gestation of pregnancy: Secondary | ICD-10-CM

## 2017-07-23 DIAGNOSIS — O9902 Anemia complicating childbirth: Secondary | ICD-10-CM | POA: Diagnosis present

## 2017-07-23 DIAGNOSIS — Z3483 Encounter for supervision of other normal pregnancy, third trimester: Secondary | ICD-10-CM | POA: Diagnosis present

## 2017-07-23 DIAGNOSIS — Z3480 Encounter for supervision of other normal pregnancy, unspecified trimester: Secondary | ICD-10-CM

## 2017-07-23 DIAGNOSIS — Z3A36 36 weeks gestation of pregnancy: Secondary | ICD-10-CM | POA: Diagnosis not present

## 2017-07-23 DIAGNOSIS — I471 Supraventricular tachycardia: Secondary | ICD-10-CM

## 2017-07-23 LAB — CBC
HCT: 31.3 % — ABNORMAL LOW (ref 36.0–46.0)
Hemoglobin: 10.3 g/dL — ABNORMAL LOW (ref 12.0–15.0)
MCH: 24.5 pg — ABNORMAL LOW (ref 26.0–34.0)
MCHC: 32.9 g/dL (ref 30.0–36.0)
MCV: 74.5 fL — AB (ref 78.0–100.0)
PLATELETS: 228 10*3/uL (ref 150–400)
RBC: 4.2 MIL/uL (ref 3.87–5.11)
RDW: 15.4 % (ref 11.5–15.5)
WBC: 9.1 10*3/uL (ref 4.0–10.5)

## 2017-07-23 LAB — POCT FERN TEST: POCT Fern Test: POSITIVE

## 2017-07-23 LAB — RPR: RPR Ser Ql: NONREACTIVE

## 2017-07-23 LAB — TYPE AND SCREEN
ABO/RH(D): O POS
ANTIBODY SCREEN: NEGATIVE

## 2017-07-23 MED ORDER — OXYTOCIN 10 UNIT/ML IJ SOLN
INTRAMUSCULAR | Status: AC
  Filled 2017-07-23: qty 1

## 2017-07-23 MED ORDER — ONDANSETRON HCL 4 MG/2ML IJ SOLN: 4.0000 mg | Freq: Four times a day (QID) | INTRAMUSCULAR | Status: DC | PRN

## 2017-07-23 MED ORDER — BENZOCAINE-MENTHOL 20-0.5 % EX AERO: 1.0000 "application " | INHALATION_SPRAY | CUTANEOUS | Status: DC | PRN

## 2017-07-23 MED ORDER — WITCH HAZEL-GLYCERIN EX PADS: 1.0000 "application " | MEDICATED_PAD | CUTANEOUS | Status: DC | PRN

## 2017-07-23 MED ORDER — LIDOCAINE HCL (PF) 1 % IJ SOLN
30.0000 mL | INTRAMUSCULAR | Status: DC | PRN
  Filled 2017-07-23: qty 30

## 2017-07-23 MED ORDER — ZOLPIDEM TARTRATE 5 MG PO TABS: 5.0000 mg | ORAL_TABLET | Freq: Every evening | ORAL | Status: DC | PRN

## 2017-07-23 MED ORDER — OXYCODONE-ACETAMINOPHEN 5-325 MG PO TABS: 2.0000 | ORAL_TABLET | ORAL | Status: DC | PRN

## 2017-07-23 MED ORDER — SENNOSIDES-DOCUSATE SODIUM 8.6-50 MG PO TABS
2.0000 | ORAL_TABLET | ORAL | Status: DC
  Administered 2017-07-24 (×2): 2 via ORAL
  Filled 2017-07-23 (×2): qty 2

## 2017-07-23 MED ORDER — DIBUCAINE 1 % RE OINT: 1.0000 "application " | TOPICAL_OINTMENT | RECTAL | Status: DC | PRN

## 2017-07-23 MED ORDER — OXYTOCIN 40 UNITS IN LACTATED RINGERS INFUSION - SIMPLE MED
INTRAVENOUS | Status: AC
  Administered 2017-07-23: 500 mL via INTRAVENOUS
  Filled 2017-07-23: qty 1000

## 2017-07-23 MED ORDER — IBUPROFEN 600 MG PO TABS
600.0000 mg | ORAL_TABLET | Freq: Four times a day (QID) | ORAL | Status: DC
  Administered 2017-07-23 – 2017-07-25 (×10): 600 mg via ORAL
  Filled 2017-07-23 (×10): qty 1

## 2017-07-23 MED ORDER — ACETAMINOPHEN 325 MG PO TABS: 650.0000 mg | ORAL_TABLET | ORAL | Status: DC | PRN

## 2017-07-23 MED ORDER — LACTATED RINGERS IV SOLN: 500.0000 mL | INTRAVENOUS | Status: DC | PRN

## 2017-07-23 MED ORDER — OXYTOCIN 40 UNITS IN LACTATED RINGERS INFUSION - SIMPLE MED
2.5000 [IU]/h | INTRAVENOUS | Status: DC
  Administered 2017-07-23: 2.5 [IU]/h via INTRAVENOUS

## 2017-07-23 MED ORDER — DIPHENHYDRAMINE HCL 25 MG PO CAPS: 25.0000 mg | ORAL_CAPSULE | Freq: Four times a day (QID) | ORAL | Status: DC | PRN

## 2017-07-23 MED ORDER — ONDANSETRON HCL 4 MG PO TABS: 4.0000 mg | ORAL_TABLET | ORAL | Status: DC | PRN

## 2017-07-23 MED ORDER — COCONUT OIL OIL
1.0000 "application " | TOPICAL_OIL | Status: DC | PRN
  Administered 2017-07-24: 1 via TOPICAL
  Filled 2017-07-23 (×2): qty 120

## 2017-07-23 MED ORDER — FLEET ENEMA 7-19 GM/118ML RE ENEM
1.0000 | ENEMA | Freq: Every day | RECTAL | Status: DC | PRN
Start: 2017-07-23 — End: 2017-07-23

## 2017-07-23 MED ORDER — LACTATED RINGERS IV SOLN: INTRAVENOUS | Status: DC

## 2017-07-23 MED ORDER — TETANUS-DIPHTH-ACELL PERTUSSIS 5-2.5-18.5 LF-MCG/0.5 IM SUSP: 0.5000 mL | Freq: Once | INTRAMUSCULAR | Status: DC

## 2017-07-23 MED ORDER — SIMETHICONE 80 MG PO CHEW: 80.0000 mg | CHEWABLE_TABLET | ORAL | Status: DC | PRN

## 2017-07-23 MED ORDER — ONDANSETRON HCL 4 MG/2ML IJ SOLN: 4.0000 mg | INTRAMUSCULAR | Status: DC | PRN

## 2017-07-23 MED ORDER — OXYCODONE-ACETAMINOPHEN 5-325 MG PO TABS: 1.0000 | ORAL_TABLET | ORAL | Status: DC | PRN

## 2017-07-23 MED ORDER — PRENATAL MULTIVITAMIN CH
1.0000 | ORAL_TABLET | Freq: Every day | ORAL | Status: DC
  Administered 2017-07-23 – 2017-07-25 (×3): 1 via ORAL
  Filled 2017-07-23 (×3): qty 1

## 2017-07-23 MED ORDER — LIDOCAINE HCL (PF) 1 % IJ SOLN
INTRAMUSCULAR | Status: AC
  Filled 2017-07-23: qty 30

## 2017-07-23 MED ORDER — OXYCODONE-ACETAMINOPHEN 5-325 MG PO TABS
1.0000 | ORAL_TABLET | ORAL | Status: DC | PRN
  Administered 2017-07-23 – 2017-07-25 (×5): 1 via ORAL
  Filled 2017-07-23 (×5): qty 1

## 2017-07-23 MED ORDER — ACETAMINOPHEN 325 MG PO TABS
650.0000 mg | ORAL_TABLET | ORAL | Status: DC | PRN
  Administered 2017-07-23 (×2): 650 mg via ORAL
  Filled 2017-07-23 (×2): qty 2

## 2017-07-23 MED ORDER — SOD CITRATE-CITRIC ACID 500-334 MG/5ML PO SOLN: 30.0000 mL | ORAL | Status: DC | PRN

## 2017-07-23 MED ORDER — OXYTOCIN BOLUS FROM INFUSION
500.0000 mL | Freq: Once | INTRAVENOUS | Status: AC
  Administered 2017-07-23: 500 mL via INTRAVENOUS

## 2017-07-23 NOTE — H&P (Signed)
Tabitha Case is a 22 y.o. Z6X0960 @ [redacted]w[redacted]d female presenting for SOL.  Clinic [facility] Prenatal Labs  Dating LMP + 10 wks Blood type: O/Positive/-- (06/26 1654)   Genetic Screen declines Antibody:Negative (06/26 1654)  Anatomic US WNl  Rubella: 4.28 (06/26 1654)  GTT Early:A1C 5.4    Third trimester: neg  RPR: Non Reactive (06/26 1654)   Flu vaccine 04/2017 HBsAg: Negative (06/26 1654)   TDaP vaccine  06/05/17                                              HIV: Non Reactive (06/26 1654)   Baby Food Breast                                          GBS: neg (For PCN allergy, check sensitivities)  Contraception Undecided 06/05/17 Pap:  Circumcision Girl fetus   Pediatrician [facility]   Support Person Shirlee Limerick   Prenatal Classes      OB History    Gravida Para Term Preterm AB Living   3 2 2     2    SAB TAB Ectopic Multiple Live Births           2     Past Medical History:  Diagnosis Date  . Ringworm    Past Surgical History:  Procedure Laterality Date  . WISDOM TOOTH EXTRACTION     Family History: Family history is unknown by patient. Social History:  reports that  has never smoked. she has never used smokeless tobacco. She reports that she does not drink alcohol or use drugs.     Maternal Diabetes: No Genetic Screening: Normal Maternal Ultrasounds/Referrals: Normal Fetal Ultrasounds or other Referrals:  None Maternal Substance Abuse:  No Significant Maternal Medications:  None Significant Maternal Lab Results:  None Other Comments:  None  Review of Systems  Constitutional: Negative for chills and fever.  Eyes: Negative for blurred vision.  Respiratory: Negative for cough and shortness of breath.   Cardiovascular: Negative for chest pain.  Gastrointestinal: Positive for abdominal pain. Negative for nausea and vomiting.  Neurological: Negative for dizziness and headaches.  Psychiatric/Behavioral: Negative.    Maternal Medical History:  Reason for admission: Rupture  of membranes and contractions.  Nausea.  Contractions: Onset was 1-2 hours ago.   Frequency: regular.   Duration is approximately 70 seconds.   Perceived severity is strong.    Fetal activity: Perceived fetal activity is normal.   Last perceived fetal movement was within the past hour.    Prenatal complications: no prenatal complications Prenatal Complications - Diabetes: none.      Last menstrual period 10/21/2016, currently breastfeeding. Maternal Exam:  Uterine Assessment: Contraction strength is firm.  Contraction duration is 80 seconds. Contraction frequency is regular.   Abdomen: Patient reports no abdominal tenderness. Fundal height is S=D.   Fetal presentation: vertex  Introitus: Normal vulva. Normal vagina.  Amniotic fluid character: clear.  Pelvis: adequate for delivery.   Cervix: Cervix evaluated by digital exam.     Fetal Exam Fetal Monitor Review: Mode: ultrasound.   Baseline rate: 140.  Variability: moderate (6-25 bpm).   Pattern: accelerations present and variable decelerations.    Fetal State Assessment: Category I - tracings are normal.  Physical Exam  Constitutional: She appears well-developed and well-nourished.  HENT:  Head: Normocephalic.  Neck: Normal range of motion.  Cardiovascular: Normal rate and regular rhythm.  Respiratory: Effort normal.  GI: Soft.  Musculoskeletal: Normal range of motion.  Skin: Skin is warm and dry.  Psychiatric: She has a normal mood and affect. Her behavior is normal. Judgment and thought content normal.    Prenatal labs: ABO, Rh: --/--/O POS, O POS (12/28 0200) Antibody: NEG (12/28 0200) Rubella: 4.28 (06/26 1654) RPR: Non Reactive (12/28 0200)  HBsAg: Negative (06/26 1654)  HIV: Non Reactive (10/25 0832)  GBS: Negative (12/28 0000)   Assessment/Plan: 22 y.o. [id number] @ 476w2d here for SOL  Active labor, imminent delivery Cervix is 10/100/+1 Labor: expectant management #Pain:  Per request #FWB Cat  1 #ID: GBS: nege #MOF:  breast #MOC: unsure #Circ: girl     Swiyyah O Harrington 07/23/2017, 2:10 AM   I confirm that I have verified the information documented in the student nurse midwife's note and that I have also personally reperformed the physical exam and all medical decision making activities.    Charlesetta GaribaldiKathryn Lorraine Kimblery Diop 07/23/2017, 3:11 AM

## 2017-07-23 NOTE — Plan of Care (Signed)
Patient progressing well. Ambulating, voiding, and pain is under control.

## 2017-07-23 NOTE — MAU Note (Signed)
Pt presents to MAU c/o ctx every . Pt denies bleeding and LOF. Pt does report some brown discharge but was also checked in the office two days ago. Pt reports good fm.

## 2017-07-24 ENCOUNTER — Encounter: Payer: Medicaid Other | Admitting: Obstetrics and Gynecology

## 2017-07-24 LAB — BIRTH TISSUE RECOVERY COLLECTION (PLACENTA DONATION)

## 2017-07-24 NOTE — Lactation Note (Signed)
This note was copied from a baby's chart. Lactation Consultation Note  Patient Name: Girl Gypsy LoreRozjae Hashem XBMWU'XToday's Date: 07/24/2017 Reason for consult: Initial assessment Breastfeeding consultation services and support information given to patient.  This is her third baby and newborn is 3234 hours old.  Mom states feedings are going well now that she is getting a deeper latch.  Instructed to feed with cues.  Discussed cluster feeding.  Encouraged to call with concerns/assist prn.  Maternal Data Does the patient have breastfeeding experience prior to this delivery?: Yes  Feeding Feeding Type: Breast Fed  LATCH Score                   Interventions    Lactation Tools Discussed/Used     Consult Status Consult Status: Follow-up Date: 07/25/17 Follow-up type: In-patient    Huston FoleyMOULDEN, Kenzie Thoreson S 07/24/2017, 1:19 PM

## 2017-07-24 NOTE — Progress Notes (Signed)
POSTPARTUM PROGRESS NOTE  Post Partum Day 1  Subjective:  Tabitha Case is a 22 y.o. Z6X0960G3P3003 s/p SVD at 2153w2d.  No acute events overnight.  Pt denies problems with ambulating, voiding or po intake.  She denies nausea or vomiting.  Pain is well controlled.  She has had flatus. She has not had bowel movement.  Lochia Minimal.   Objective: Blood pressure (!) 116/56, pulse 86, temperature 98.6 F (37 C), temperature source Axillary, resp. rate 16, last menstrual period 10/21/2016, SpO2 100 %, unknown if currently breastfeeding.  Physical Exam:  General: alert, cooperative and no distress Chest: no respiratory distress Heart:regular rate, distal pulses intact Abdomen: soft, nontender,  Uterine Fundus: firm, appropriately tender DVT Evaluation: No calf swelling or tenderness Extremities: trace edema Skin: warm, dry  Recent Labs    07/23/17 0217  HGB 10.3*  HCT 31.3*    Assessment/Plan: Tabitha LoreRozjae Grivas is a 22 y.o. A5W0981G3P3003 s/p SVD at 1653w2d   PPD#1 - Doing well Contraception: undecided Feeding: breast Dispo: Plan for discharge tomorrow.   LOS: 1 day   Alroy BailiffParker W Marsia Cino MD 07/24/2017, 5:54 AM

## 2017-07-25 MED ORDER — SENNOSIDES-DOCUSATE SODIUM 8.6-50 MG PO TABS: 2.0000 | ORAL_TABLET | ORAL | 0 refills | Status: AC

## 2017-07-25 MED ORDER — IBUPROFEN 600 MG PO TABS: 600.0000 mg | ORAL_TABLET | Freq: Four times a day (QID) | ORAL | 0 refills | Status: AC

## 2017-07-25 NOTE — Discharge Summary (Signed)
OB Discharge Summary     Patient Name: Tabitha Case DOB: 11-Nov-1995 MRN: [id number]  Date of admission: 07/23/2017 Delivering MD: Laurence ComptonHARRINGTON, SWIYYAH O   Date of discharge: 07/25/2017  Admitting diagnosis: 39 WEEKS CTX Intrauterine pregnancy: 6043w2d     Secondary diagnosis:  Principal Problem:   SVD (spontaneous vaginal delivery) Active Problems:   Anemia of mother in pregnancy, antepartum   Indication for care in labor and delivery, antepartum   Normal delivery  Discharge diagnosis: Term Pregnancy Delivered                                                                                                Post partum procedures:n/a  Augmentation: none  Complications: None  Hospital course:  Onset of Labor With Vaginal Delivery     22 y.o. yo G3P3003 at 1743w2d was admitted in Active Labor on 07/23/2017. Patient had an uncomplicated labor course as follows:  Membrane Rupture Time/Date: 1:57 AM ,07/23/2017   Intrapartum Procedures: Episiotomy: None [1]                                         Lacerations:  Periurethral [8]  Patient had a delivery of a Viable infant. 07/23/2017  Information for the patient's newborn:  Boris SharperGaymon, Girl Kyilee [[id number]][[id number]]  Delivery Method: Vaginal, Spontaneous(Filed from Delivery Summary)    Pateint had an uncomplicated postpartum course.  She is ambulating, tolerating a regular diet, passing flatus, and urinating well. Patient is discharged home in stable condition on 07/25/17.   Physical exam  Vitals:   07/23/17 1731 07/24/17 0558 07/24/17 0732 07/24/17 1740  BP: (!) 116/56 (!) 106/53  108/67  Pulse: 86 70  81  Resp: 16 16  16   Temp: 98.6 F (37 C) 97.6 F (36.4 C)  98.7 F (37.1 C)  TempSrc: Axillary Oral  Oral  SpO2: 100%     Weight:   87.4 kg (192 lb 9.6 oz)    General: alert, cooperative and no distress Lochia: appropriate Uterine Fundus: firm Incision: N/A DVT Evaluation: No evidence of DVT seen on physical exam. No significant  calf/ankle edema. Labs: Lab Results  Component Value Date   WBC 9.1 07/23/2017   HGB 10.3 (L) 07/23/2017   HCT 31.3 (L) 07/23/2017   MCV 74.5 (L) 07/23/2017   PLT 228 07/23/2017   CMP Latest Ref Rng & Units 03/16/2017  Glucose 65 - 99 mg/dL 76  BUN 6 - 20 mg/dL 7  Creatinine 0.860.44 - 5.781.00 mg/dL 4.690.50  Sodium 629135 - 528145 mmol/L 135  Potassium 3.5 - 5.1 mmol/L 4.3  Chloride 101 - 111 mmol/L 108  CO2 22 - 32 mmol/L 19(L)  Calcium 8.9 - 10.3 mg/dL 4.1(L8.7(L)  Total Protein 6.5 - 8.1 g/dL 6.7  Total Bilirubin 0.3 - 1.2 mg/dL 0.5  Alkaline Phos 38 - 126 U/L 56  AST 15 - 41 U/L 23  ALT 14 - 54 U/L 8(L)    Discharge instruction: per After Visit Summary and "Baby and Me Booklet".  After visit meds:  Allergies as of 07/25/2017   No Known Allergies     Medication List    STOP taking these medications   prenatal multivitamin Tabs tablet     TAKE these medications   acetaminophen 500 MG tablet Commonly known as:  TYLENOL Take 1,000 mg by mouth every 6 (six) hours as needed for mild pain or headache.   ferrous gluconate 324 MG tablet Commonly known as:  FERGON Take 1 tablet (324 mg total) by mouth 2 (two) times daily with a meal.   ibuprofen 600 MG tablet Commonly known as:  ADVIL,MOTRIN Take 1 tablet (600 mg total) by mouth every 6 (six) hours.   senna-docusate 8.6-50 MG tablet Commonly known as:  Senokot-S Take 2 tablets by mouth daily. Start taking on:  07/26/2017       Diet: routine diet  Activity: Advance as tolerated. Pelvic rest for 6 weeks.   Outpatient follow up:4 weeks Follow up Appt: Future Appointments  Date Time Provider Department Center  09/03/2017  9:30 AM Reva Bores, MD [facility]    Postpartum contraception: Undecided  Newborn Data: Live born female  Birth Weight: 6 lb 11.1 oz (3036 g) APGAR: 9, 9  Newborn Delivery   Birth date/time:  07/23/2017 02:23:00 Delivery type:  Vaginal, Spontaneous     Baby Feeding:  Breast Disposition:home with mother   07/25/2017 Alroy Bailiff, MD  OB FELLOW DISCHARGE ATTESTATION  I have seen and examined this patient and agree with above documentation in the resident's note.   Pt doing well at time of discharge. PPV scheduled as above. Undecided on PP contraception, counseled and handouts given.  Frederik Pear, MD OB Fellow 9:18 AM

## 2017-07-25 NOTE — Discharge Instructions (Signed)
Postpartum Care After Vaginal Delivery °The period of time right after you deliver your newborn is called the postpartum period. °What kind of medical care will I receive? °· You may continue to receive fluids and medicines through an IV tube inserted into one of your veins. °· If an incision was made near your vagina (episiotomy) or if you had some vaginal tearing during delivery, cold compresses may be placed on your episiotomy or your tear. This helps to reduce pain and swelling. °· You may be given a squirt bottle to use when you go to the bathroom. You may use this until you are comfortable wiping as usual. To use the squirt bottle, follow these steps: °? Before you urinate, fill the squirt bottle with warm water. Do not use hot water. °? After you urinate, while you are sitting on the toilet, use the squirt bottle to rinse the area around your urethra and vaginal opening. This rinses away any urine and blood. °? You may do this instead of wiping. As you start healing, you may use the squirt bottle before wiping yourself. Make sure to wipe gently. °? Fill the squirt bottle with clean water every time you use the bathroom. °· You will be given sanitary pads to wear. °How can I expect to feel? °· You may not feel the need to urinate for several hours after delivery. °· You will have some soreness and pain in your abdomen and vagina. °· If you are breastfeeding, you may have uterine contractions every time you breastfeed for up to several weeks postpartum. Uterine contractions help your uterus return to its normal size. °· It is normal to have vaginal bleeding (lochia) after delivery. The amount and appearance of lochia is often similar to a menstrual period in the first week after delivery. It will gradually decrease over the next few weeks to a dry, yellow-brown discharge. For most women, lochia stops completely by 6-8 weeks after delivery. Vaginal bleeding can vary from woman to woman. °· Within the first few  days after delivery, you may have breast engorgement. This is when your breasts feel heavy, full, and uncomfortable. Your breasts may also throb and feel hard, tightly stretched, warm, and tender. After this occurs, you may have milk leaking from your breasts. Your health care provider can help you relieve discomfort due to breast engorgement. Breast engorgement should go away within a few days. °· You may feel more sad or worried than normal due to hormonal changes after delivery. These feelings should not last more than a few days. If these feelings do not go away after several days, speak with your health care provider. °How should I care for myself? °· Tell your health care provider if you have pain or discomfort. °· Drink enough water to keep your urine clear or pale yellow. °· Wash your hands thoroughly with soap and water for at least 20 seconds after changing your sanitary pads, after using the toilet, and before holding or feeding your baby. °· If you are not breastfeeding, avoid touching your breasts a lot. Doing this can make your breasts produce more milk. °· If you become weak or lightheaded, or you feel like you might faint, ask for help before: °? Getting out of bed. °? Showering. °· Change your sanitary pads frequently. Watch for any changes in your flow, such as a sudden increase in volume, a change in color, the passing of large blood clots. If you pass a blood clot from your vagina, save it   to show to your health care provider. Do not flush blood clots down the toilet without having your health care provider look at them.  Make sure that all your vaccinations are up to date. This can help protect you and your baby from getting certain diseases. You may need to have immunizations done before you leave the hospital.  If desired, talk with your health care provider about methods of family planning or birth control (contraception). How can I start bonding with my baby? Spending as much time as  possible with your baby is very important. During this time, you and your baby can get to know each other and develop a bond. Having your baby stay with you in your room (rooming in) can give you time to get to know your baby. Rooming in can also help you become comfortable caring for your baby. Breastfeeding can also help you bond with your baby. How can I plan for returning home with my baby?  Make sure that you have a car seat installed in your vehicle. ? Your car seat should be checked by a certified car seat installer to make sure that it is installed safely. ? Make sure that your baby fits into the car seat safely.  Ask your health care provider any questions you have about caring for yourself or your baby. Make sure that you are able to contact your health care provider with any questions after leaving the hospital. This information is not intended to replace advice given to you by your health care provider. Make sure you discuss any questions you have with your health care provider. Document Released: 04/22/2007 Document Revised: 11/28/2015 Document Reviewed: 05/30/2015 Elsevier Interactive Patient Education  2018 [employer].   Breastfeeding Choosing to breastfeed is one of the best decisions you can make for yourself and your baby. A change in hormones during pregnancy causes your breasts to make breast milk in your milk-producing glands. Hormones prevent breast milk from being released before your baby is born. They also prompt milk flow after birth. Once breastfeeding has begun, thoughts of your baby, as well as his or her sucking or crying, can stimulate the release of milk from your milk-producing glands. Benefits of breastfeeding Research shows that breastfeeding offers many health benefits for infants and mothers. It also offers a cost-free and convenient way to feed your baby. For your baby  Your first milk (colostrum) helps your baby's digestive system to function  better.  Special cells in your milk (antibodies) help your baby to fight off infections.  Breastfed babies are less likely to develop asthma, allergies, obesity, or type 2 diabetes. They are also at lower risk for sudden infant death syndrome (SIDS).  Nutrients in breast milk are better able to meet your babys needs compared to infant formula.  Breast milk improves your baby's brain development. For you  Breastfeeding helps to create a very special bond between you and your baby.  Breastfeeding is convenient. Breast milk costs nothing and is always available at the correct temperature.  Breastfeeding helps to burn calories. It helps you to lose the weight that you gained during pregnancy.  Breastfeeding makes your uterus return faster to its size before pregnancy. It also slows bleeding (lochia) after you give birth.  Breastfeeding helps to lower your risk of developing type 2 diabetes, osteoporosis, rheumatoid arthritis, cardiovascular disease, and breast, ovarian, uterine, and endometrial cancer later in life. Breastfeeding basics Starting breastfeeding  Find a comfortable place to sit or lie down,  with your neck and back well-supported.  Place a pillow or a rolled-up blanket under your baby to bring him or her to the level of your breast (if you are seated). Nursing pillows are specially designed to help support your arms and your baby while you breastfeed.  Make sure that your baby's tummy (abdomen) is facing your abdomen.  Gently massage your breast. With your fingertips, massage from the outer edges of your breast inward toward the nipple. This encourages milk flow. If your milk flows slowly, you may need to continue this action during the feeding.  Support your breast with 4 fingers underneath and your thumb above your nipple (make the letter "C" with your hand). Make sure your fingers are well away from your nipple and your babys mouth.  Stroke your baby's lips gently with  your finger or nipple.  When your baby's mouth is open wide enough, quickly bring your baby to your breast, placing your entire nipple and as much of the areola as possible into your baby's mouth. The areola is the colored area around your nipple. ? More areola should be visible above your baby's upper lip than below the lower lip. ? Your baby's lips should be opened and extended outward (flanged) to ensure an adequate, comfortable latch. ? Your baby's tongue should be between his or her lower gum and your breast.  Make sure that your baby's mouth is correctly positioned around your nipple (latched). Your baby's lips should create a seal on your breast and be turned out (everted).  It is common for your baby to suck about 2-3 minutes in order to start the flow of breast milk. Latching Teaching your baby how to latch onto your breast properly is very important. An improper latch can cause nipple pain, decreased milk supply, and poor weight gain in your baby. Also, if your baby is not latched onto your nipple properly, he or she may swallow some air during feeding. This can make your baby fussy. Burping your baby when you switch breasts during the feeding can help to get rid of the air. However, teaching your baby to latch on properly is still the best way to prevent fussiness from swallowing air while breastfeeding. Signs that your baby has successfully latched onto your nipple  Silent tugging or silent sucking, without causing you pain. Infant's lips should be extended outward (flanged).  Swallowing heard between every 3-4 sucks once your milk has started to flow (after your let-down milk reflex occurs).  Muscle movement above and in front of his or her ears while sucking.  Signs that your baby has not successfully latched onto your nipple  Sucking sounds or smacking sounds from your baby while breastfeeding.  Nipple pain.  If you think your baby has not latched on correctly, slip your  finger into the corner of your babys mouth to break the suction and place it between your baby's gums. Attempt to start breastfeeding again. Signs of successful breastfeeding Signs from your baby  Your baby will gradually decrease the number of sucks or will completely stop sucking.  Your baby will fall asleep.  Your baby's body will relax.  Your baby will retain a small amount of milk in his or her mouth.  Your baby will let go of your breast by himself or herself.  Signs from you  Breasts that have increased in firmness, weight, and size 1-3 hours after feeding.  Breasts that are softer immediately after breastfeeding.  Increased milk volume, as  well as a change in milk consistency and color by the fifth day of breastfeeding.  Nipples that are not sore, cracked, or bleeding.  Signs that your baby is getting enough milk  Wetting at least 1-2 diapers during the first 24 hours after birth.  Wetting at least 5-6 diapers every 24 hours for the first week after birth. The urine should be clear or pale yellow by the age of 5 days.  Wetting 6-8 diapers every 24 hours as your baby continues to grow and develop.  At least 3 stools in a 24-hour period by the age of 5 days. The stool should be soft and yellow.  At least 3 stools in a 24-hour period by the age of 7 days. The stool should be seedy and yellow.  No loss of weight greater than 10% of birth weight during the first 3 days of life.  Average weight gain of 4-7 oz (113-198 g) per week after the age of 4 days.  Consistent daily weight gain by the age of 5 days, without weight loss after the age of 2 weeks. After a feeding, your baby may spit up a small amount of milk. This is normal. Breastfeeding frequency and duration Frequent feeding will help you make more milk and can prevent sore nipples and extremely full breasts (breast engorgement). Breastfeed when you feel the need to reduce the fullness of your breasts or when your  baby shows signs of hunger. This is called "breastfeeding on demand." Signs that your baby is hungry include:  Increased alertness, activity, or restlessness.  Movement of the head from side to side.  Opening of the mouth when the corner of the mouth or cheek is stroked (rooting).  Increased sucking sounds, smacking lips, cooing, sighing, or squeaking.  Hand-to-mouth movements and sucking on fingers or hands.  Fussing or crying.  Avoid introducing a pacifier to your baby in the first 4-6 weeks after your baby is born. After this time, you may choose to use a pacifier. Research has shown that pacifier use during the first year of a baby's life decreases the risk of sudden infant death syndrome (SIDS). Allow your baby to feed on each breast as long as he or she wants. When your baby unlatches or falls asleep while feeding from the first breast, offer the second breast. Because newborns are often sleepy in the first few weeks of life, you may need to awaken your baby to get him or her to feed. Breastfeeding times will vary from baby to baby. However, the following rules can serve as a guide to help you make sure that your baby is properly fed:  Newborns (babies 35 weeks of age or younger) may breastfeed every 1-3 hours.  Newborns should not go without breastfeeding for longer than 3 hours during the day or 5 hours during the night.  You should breastfeed your baby a minimum of 8 times in a 24-hour period.  Breast milk pumping Pumping and storing breast milk allows you to make sure that your baby is exclusively fed your breast milk, even at times when you are unable to breastfeed. This is especially important if you go back to work while you are still breastfeeding, or if you are not able to be present during feedings. Your lactation consultant can help you find a method of pumping that works best for you and give you guidelines about how long it is safe to store breast milk. Caring for your  breasts while you breastfeed  Nipples can become dry, cracked, and sore while breastfeeding. The following recommendations can help keep your breasts moisturized and healthy:  Avoid using soap on your nipples.  Wear a supportive bra designed especially for nursing. Avoid wearing underwire-style bras or extremely tight bras (sports bras).  Air-dry your nipples for 3-4 minutes after each feeding.  Use only cotton bra pads to absorb leaked breast milk. Leaking of breast milk between feedings is normal.  Use lanolin on your nipples after breastfeeding. Lanolin helps to maintain your skin's normal moisture barrier. Pure lanolin is not harmful (not toxic) to your baby. You may also hand express a few drops of breast milk and gently massage that milk into your nipples and allow the milk to air-dry.  In the first few weeks after giving birth, some women experience breast engorgement. Engorgement can make your breasts feel heavy, warm, and tender to the touch. Engorgement peaks within 3-5 days after you give birth. The following recommendations can help to ease engorgement:  Completely empty your breasts while breastfeeding or pumping. You may want to start by applying warm, moist heat (in the shower or with warm, water-soaked hand towels) just before feeding or pumping. This increases circulation and helps the milk flow. If your baby does not completely empty your breasts while breastfeeding, pump any extra milk after he or she is finished.  Apply ice packs to your breasts immediately after breastfeeding or pumping, unless this is too uncomfortable for you. To do this: ? Put ice in a plastic bag. ? Place a towel between your skin and the bag. ? Leave the ice on for 20 minutes, 2-3 times a day.  Make sure that your baby is latched on and positioned properly while breastfeeding.  If engorgement persists after 48 hours of following these recommendations, contact your health care provider or a Environmental consultant. Overall health care recommendations while breastfeeding  Eat 3 healthy meals and 3 snacks every day. Well-nourished mothers who are breastfeeding need an additional 450-500 calories a day. You can meet this requirement by increasing the amount of a balanced diet that you eat.  Drink enough water to keep your urine pale yellow or clear.  Rest often, relax, and continue to take your prenatal vitamins to prevent fatigue, stress, and low vitamin and mineral levels in your body (nutrient deficiencies).  Do not use any products that contain nicotine or tobacco, such as cigarettes and e-cigarettes. Your baby may be harmed by chemicals from cigarettes that pass into breast milk and exposure to secondhand smoke. If you need help quitting, ask your health care provider.  Avoid alcohol.  Do not use illegal drugs or marijuana.  Talk with your health care provider before taking any medicines. These include over-the-counter and prescription medicines as well as vitamins and herbal supplements. Some medicines that may be harmful to your baby can pass through breast milk.  It is possible to become pregnant while breastfeeding. If birth control is desired, ask your health care provider about options that will be safe while breastfeeding your baby. Where to find more information: [employer]: www.llli.org Contact a health care provider if:  You feel like you want to stop breastfeeding or have become frustrated with breastfeeding.  Your nipples are cracked or bleeding.  Your breasts are red, tender, or warm.  You have: ? Painful breasts or nipples. ? A swollen area on either breast. ? A fever or chills. ? Nausea or vomiting. ? Drainage other than breast milk  from your nipples.  Your breasts do not become full before feedings by the fifth day after you give birth.  You feel sad and depressed.  Your baby is: ? Too sleepy to eat well. ? Having trouble  sleeping. ? More than 51 week old and wetting fewer than 6 diapers in a 24-hour period. ? Not gaining weight by 24 days of age.  Your baby has fewer than 3 stools in a 24-hour period.  Your baby's skin or the white parts of his or her eyes become yellow. Get help right away if:  Your baby is overly tired (lethargic) and does not want to wake up and feed.  Your baby develops an unexplained fever. Summary  Breastfeeding offers many health benefits for infant and mothers.  Try to breastfeed your infant when he or she shows early signs of hunger.  Gently tickle or stroke your baby's lips with your finger or nipple to allow the baby to open his or her mouth. Bring the baby to your breast. Make sure that much of the areola is in your baby's mouth. Offer one side and burp the baby before you offer the other side.  Talk with your health care provider or lactation consultant if you have questions or you face problems as you breastfeed. This information is not intended to replace advice given to you by your health care provider. Make sure you discuss any questions you have with your health care provider. Document Released: 06/25/2005 Document Revised: 07/27/2016 Document Reviewed: 07/27/2016 Elsevier Interactive Patient Education  2018 [employer].   Contraception Choices Contraception, also called birth control, means things to use or ways to try not to get pregnant. Hormonal birth control This kind of birth control uses hormones. Here are some types of hormonal birth control:  A tube that is put under skin of the arm (implant). The tube can stay in for as long as 3 years.  Shots to get every 3 months (injections).  Pills to take every day (birth control pills).  A patch to change 1 time each week for 3 weeks (birth control patch). After that, the patch is taken off for 1 week.  A ring to put in the vagina. The ring is left in for 3 weeks. Then it is taken out of the vagina for 1 week.  Then a new ring is put in.  Pills to take after unprotected sex (emergency birth control pills).  Barrier birth control Here are some types of barrier birth control:  A thin covering that is put on the penis before sex (female condom). The covering is thrown away after sex.  A soft, loose covering that is put in the vagina before sex (female condom). The covering is thrown away after sex.  A rubber bowl that sits over the cervix (diaphragm). The bowl must be made for you. The bowl is put into the vagina before sex. The bowl is left in for 6-8 hours after sex. It is taken out within 24 hours.  A small, soft cup that fits over the cervix (cervical cap). The cup must be made for you. The cup can be left in for 6-8 hours after sex. It is taken out within 48 hours.  A sponge that is put into the vagina before sex. It must be left in for at least 6 hours after sex. It must be taken out within 30 hours. Then it is thrown away.  A chemical that kills or stops sperm from getting into  the uterus (spermicide). It may be a pill, cream, jelly, or foam to put in the vagina. The chemical should be used at least 10-15 minutes before sex.  IUD (intrauterine) birth control An IUD is a small, T-shaped piece of plastic. It is put inside the uterus. There are two kinds:  Hormone IUD. This kind can stay in for 3-5 years.  Copper IUD. This kind can stay in for 10 years.  Permanent birth control Here are some types of permanent birth control:  Surgery to block the fallopian tubes.  Having an insert put into each fallopian tube.  Surgery to tie off the tubes that carry sperm (vasectomy).  Natural planning birth control Here are some types of natural planning birth control:  Not having sex on the days the woman could get pregnant.  Using a calendar: ? To keep track of the length of each period. ? To find out what days pregnancy can happen. ? To plan to not have sex on days when pregnancy can  happen.  Watching for symptoms of ovulation and not having sex during ovulation. One way the woman can check for ovulation is to check her temperature.  Waiting to have sex until after ovulation.  Summary  Contraception, also called birth control, means things to use or ways to try not to get pregnant.  Hormonal methods of birth control include implants, injections, pills, patches, vaginal rings, and emergency birth control pills.  Barrier methods of birth control can include female condoms, female condoms, diaphragms, cervical caps, sponges, and spermicides.  There are two types of IUD (intrauterine device) birth control. An IUD can be put in a woman's uterus to prevent pregnancy for 3-5 years.  Permanent sterilization can be done through a procedure for males, females, or both.  Natural planning methods involve not having sex on the days when the woman could get pregnant. This information is not intended to replace advice given to you by your health care provider. Make sure you discuss any questions you have with your health care provider. Document Released: 04/22/2009 Document Revised: 07/05/2016 Document Reviewed: 07/05/2016 Elsevier Interactive Patient Education  2017 [employer]

## 2017-08-19 ENCOUNTER — Encounter (INDEPENDENT_AMBULATORY_CARE_PROVIDER_SITE_OTHER): Payer: Medicaid Other | Admitting: *Deleted

## 2017-09-02 NOTE — Progress Notes (Deleted)
Subjective:     Tabitha Case is a 22 y.o. female who presents for a postpartum visit. She is 5 weeks postpartum following a vaginal delivery. I have fully reviewed the prenatal and intrapartum course. The delivery was at 39 gestational weeks. Outcome: vaginal. Anesthesia: none . Postpartum course has been uncomplicated. Baby's course has been ***. Baby is feeding by {breast/bottle:69}. Bleeding {vag bleed:12292}. Bowel function is {normal:32111}. Bladder function is {normal:32111}. Patient {is/is not:9024} sexually active. Contraception method is {contraceptive method:5051}. Postpartum depression screening: {neg default:13464::"negative"}.  {Common ambulatory SmartLinks:19316}  Review of Systems {ros; complete:30496}   Objective:    There were no vitals taken for this visit.  General:  {gen appearance:16600}   Breasts:  {breast exam:1202::"inspection negative, no nipple discharge or bleeding, no masses or nodularity palpable"}  Lungs: {lung exam:16931}  Heart:  {heart exam:5510}  Abdomen: {abdomen exam:16834}   Vulva:  {labia exam:12198}  Vagina: {vagina exam:12200}  Cervix:  {cervix exam:14595}  Corpus: {uterus exam:12215}  Adnexa:  {adnexa exam:12223}  Rectal Exam: {rectal/vaginal exam:12274}        Assessment:    *** postpartum exam. Pap smear {done:10129} at today's visit.   Plan:    1. Contraception: {method:5051} 2. *** 3. Follow up in: {1-10:13787} {time; units:19136} or as needed.

## 2017-09-03 ENCOUNTER — Ambulatory Visit: Payer: Medicaid Other | Admitting: Family Medicine

## 2017-09-11 ENCOUNTER — Other Ambulatory Visit (HOSPITAL_COMMUNITY)
Admission: RE | Admit: 2017-09-11 | Discharge: 2017-09-11 | Disposition: A | Discharge status: Home / Self Care | Payer: Medicaid Other | Source: Ambulatory Visit | Attending: Family Medicine | Admitting: Family Medicine

## 2017-09-11 ENCOUNTER — Encounter: Payer: Self-pay | Admitting: *Deleted

## 2017-09-11 ENCOUNTER — Ambulatory Visit (INDEPENDENT_AMBULATORY_CARE_PROVIDER_SITE_OTHER): Payer: Medicaid Other | Admitting: Family Medicine

## 2017-09-11 ENCOUNTER — Encounter: Payer: Self-pay | Admitting: Family Medicine

## 2017-09-11 DIAGNOSIS — Z1389 Encounter for screening for other disorder: Secondary | ICD-10-CM

## 2017-09-11 NOTE — Patient Instructions (Signed)
Breast Pumping Tips °If you are breastfeeding, there may be times when you cannot feed your baby directly. Returning to work or going on a trip are common examples. Pumping allows you to store breast milk and feed it to your baby later. °You may not get much milk when you first start to pump. Your breasts should start to make more after a few days. If you pump at the times you usually feed your baby, you may be able to keep making enough milk to feed your baby without also using formula. The more often you pump, the more milk you will produce. °When should I pump? °· You can begin to pump soon after delivery. However, some experts recommend waiting about 4 weeks before giving your infant a bottle to make sure breastfeeding is going well. °· If you plan to return to work, begin pumping a few weeks before. This will help you develop techniques that work best for you. It also lets you build up a supply of breast milk. °· When you are with your infant, feed on demand and pump after each feeding. °· When you are away from your infant for several hours, pump for about 15 minutes every 2-3 hours. Pump both breasts at the same time if you can. °· If your infant has a formula feeding, make sure to pump around the same time. °· If you drink any alcohol, wait 2 hours before pumping. °How do I prepare to pump? °Your let-down reflex is the natural reaction to stimulation that makes your breast milk flow. It is easier to stimulate this reflex when you are relaxed. Find relaxation techniques that work for you. If you have difficulty with your let-down reflex, try these methods: °· Smell one of your infant's blankets or an item of clothing. °· Look at a picture or video of your infant. °· Sit in a quiet, private space. °· Massage the breast you plan to pump. °· Place soothing warmth on the breast. °· Play relaxing music. ° °What are some general breast pumping tips? °· Wash your hands before you pump. You do not need to wash your  nipples or breasts. °· There are three ways to pump. °? You can use your hand to massage and compress your breast. °? You can use a handheld manual pump. °? You can use an electric pump. °· Make sure the suction cup (flange) on the breast pump is the right size. Place the flange directly over the nipple. If it is the wrong size or placed the wrong way, it may be painful and cause nipple damage. °· If pumping is uncomfortable, apply a small amount of purified or modified lanolin to your nipple and areola. °· If you are using an electric pump, adjust the speed and suction power to be more comfortable. °· If pumping is painful or if you are not getting very much milk, you may need a different type of pump. A lactation consultant can help you determine what type of pump to use. °· Keep a full water bottle near you at all times. Drinking lots of fluid helps you make more milk. °· You can store your milk to use later. Pumped breast milk can be stored in a sealable, sterile container or plastic bag. Label all stored breast milk with the date you pumped it. °? Milk can stay out at room temperature for up to 8 hours. °? You can store your milk in the refrigerator for up to 8 days. °? You can   store your milk in the freezer for 3 months. Thaw frozen milk using warm water. Do not put it in the microwave. °· Do not smoke. Smoking can lower your milk supply and harm your infant. If you need help quitting, ask your health care provider to recommend a program. °When should I call my health care provider or a lactation consultant? °· You are having trouble pumping. °· You are concerned that you are not making enough milk. °· You have nipple pain, soreness, or redness. °· You want to use birth control. Birth control pills may lower your milk supply. Talk to your health care provider about your options. °This information is not intended to replace advice given to you by your health care provider. Make sure you discuss any questions  you have with your health care provider. °Document Released: 12/13/2009 Document Revised: 12/07/2015 Document Reviewed: 04/17/2013 °[employer] Interactive Patient Education © 2017 [employer] ° °

## 2017-09-11 NOTE — Progress Notes (Signed)
Subjective:     Tabitha Case is a 22 y.o. female who presents for a postpartum visit. She is six weeks  postpartum following a spontaneous vaginal delivery. I have fully reviewed the prenatal and intrapartum course. The delivery was at 39  gestational weeks. Outcome: vaginal. Anesthesia: none. Postpartum course has been uncomplicated. Baby's course has been uncomplicated. Baby is feeding by breast. Bleeding not at this time. Bowel function is normal. Bladder function is normal. Patient is sexually active. Contraception method is nothing at this time. Postpartum depression screening:negative.  The following portions of the patient's history were reviewed and updated as appropriate: allergies, current medications, past family history, past medical history, past social history, past surgical history and problem list.  Review of Systems Pertinent items are noted in HPI.   Objective:    There were no vitals taken for this visit.  General:  alert, cooperative and appears stated age   Breasts:  patient voiced no concerns  Lungs: normal WOB  Heart:  RR  Abdomen: soft, non-tender; bowel sounds normal; no masses,  no organomegaly   Vulva:  normal  Vagina: normal vagina  Cervix:  multiparous appearance  Corpus: not examined  Adnexa:  not evaluated  Rectal Exam: Not performed.        Assessment:     Normal postpartum exam. Pap smear done at today's visit.   Plan:    1. Contraception: condoms. Discussed IUD options and use of ECP if needed for unprotected sex. Reviewed importance of condom use. Reviewed lactational ammenorrhea 2. Mood- WNL 3. Infant feeding- Breastfeeding exclusively, every 2-3 hours. Reports good weight gain in infant. She will be returning to work next week. Plans to pump and provided education about pumping schedule and milk storage. Reviewed we can provide a letter if needed for accommodations (private place, pumping at 10/12/2pm). Discussed joining support group to help with  longevity of breastfeeding 3. Follow up in: PRN IUD insertion

## 2017-09-12 LAB — CYTOLOGY - PAP: Diagnosis: NEGATIVE
